# Patient Record
Sex: Female | Born: 1950 | Race: White | Hispanic: No | Marital: Married | Smoking: Former smoker
Health system: Southern US, Community
[De-identification: ages and names within clinical notes are randomized; demographics above are authoritative.]

## PROBLEM LIST (undated history)

## (undated) DIAGNOSIS — F419 Anxiety disorder, unspecified: Secondary | ICD-10-CM

## (undated) DIAGNOSIS — F32A Depression, unspecified: Secondary | ICD-10-CM

## (undated) DIAGNOSIS — C801 Malignant (primary) neoplasm, unspecified: Secondary | ICD-10-CM

## (undated) DIAGNOSIS — R519 Headache, unspecified: Secondary | ICD-10-CM

## (undated) DIAGNOSIS — G8929 Other chronic pain: Secondary | ICD-10-CM

## (undated) DIAGNOSIS — F329 Major depressive disorder, single episode, unspecified: Secondary | ICD-10-CM

## (undated) DIAGNOSIS — D649 Anemia, unspecified: Secondary | ICD-10-CM

## (undated) DIAGNOSIS — R51 Headache: Secondary | ICD-10-CM

## (undated) DIAGNOSIS — I10 Essential (primary) hypertension: Secondary | ICD-10-CM

## (undated) DIAGNOSIS — M1990 Unspecified osteoarthritis, unspecified site: Secondary | ICD-10-CM

## (undated) DIAGNOSIS — M25559 Pain in unspecified hip: Secondary | ICD-10-CM

## (undated) HISTORY — PX: ABDOMINAL HYSTERECTOMY: SHX81

## (undated) HISTORY — PX: CATARACT EXTRACTION, BILATERAL: SHX1313

## (undated) HISTORY — PX: KNEE ARTHROSCOPY WITH MENISCAL REPAIR: SHX5653

## (undated) HISTORY — PX: BONE MARROW HARVEST: SHX896

## (undated) HISTORY — PX: BACK SURGERY: SHX140

## (undated) HISTORY — PX: TONSILLECTOMY: SUR1361

## (undated) HISTORY — PX: APPENDECTOMY: SHX54

## (undated) HISTORY — PX: EYE SURGERY: SHX253

---

## 1981-06-03 HISTORY — PX: ECTOPIC PREGNANCY SURGERY: SHX613

## 2017-09-24 ENCOUNTER — Ambulatory Visit: Payer: Self-pay | Admitting: Orthopedic Surgery

## 2017-09-30 ENCOUNTER — Ambulatory Visit: Payer: Self-pay | Admitting: Orthopedic Surgery

## 2017-09-30 NOTE — H&P (View-Only) (Signed)
Kristina Rowland is an 67 y.o. female.   Chief Complaint: back and leg pain HPI: Reason for Visit: lumbar spine  Context: The patient is 1 month, 1 week out from when symptoms began.  Location (Lower Extremity): lower back pain  Severity: pain level 3/10  Past Medical Hx Spinal stenosis of lumbar region Degenerative lumbar spinal stenosis Lumbar radiculopathy Osteoarthritis of right knee joint ADD/ADHD Cancer Colitis/Stomach Ulcers Headaches High Cholesterol Hypertension Leukemia Migraines Swelling of Legs/Feet/Hands Urinary Tract Infection  Social History Smoking Status: Former smoker Current Non-smoker Tobacco-years of use: 17 Chewing tobacco: none Alcohol intake: None Hand Dominance: Right Work related injury?: N Advance directive: N Freight forwarder of Attorney: N  Surgical History Removal of tonsils Knee Joint Replacement Hysterectomy Appendectomy  Allergies:  Allergies  Allergen Reactions  . Cephalosporins Anaphylaxis, Swelling and Rash    Has patient had a PCN reaction causing immediate rash, facial/tongue/throat swelling, SOB or lightheadedness with hypotension: Yes Has patient had a PCN reaction causing severe rash involving mucus membranes or skin necrosis: Yes Has patient had a PCN reaction that required hospitalization:[facility] Has patient had a PCN reaction occurring within the last 10 years: No If all of the above answers are "NO", then may proceed with Cephalosporin use.   Marland Kitchen Penicillins Anaphylaxis, Swelling and Rash    Has patient had a PCN reaction causing immediate rash, facial/tongue/throat swelling, SOB or lightheadedness with hypotension: Yes Has patient had a PCN reaction causing severe rash involving mucus membranes or skin necrosis: Yes Has patient had a PCN reaction that required hospitalization:No Has patient had a PCN reaction occurring within the last 10 years: No If all of the above answers are "NO", then may  proceed with Cephalosporin use.     Family Hx Brother - Alcoholism   - Heart disease   - Hypertensive disorder Mother - Arthritis   - Diabetes mellitus   - Heart disease   - Hypertensive disorder   - Arthritis   - Hypertensive disorder Sister - Family history of cancer   - Heart disease   - Hypertensive disorder Father - Diabetes mellitus   - Heart disease   - Hypertensive disorder  Medications dextroamphetamine-amphetamine ER 25 mg 24hr capsule,extend release TAKE 1 CAPSULE BY MOUTH EVERY DAY  DULoxetine 60 mg capsule,delayed release TAKE 2 CAPSULES BY MOUTH EVERY DAY  estradiol 1 mg tablet TAKE 1 TABLET BY MOUTH EVERY DAY  fluconazole 150 mg tablet TAKE 1 TABLET X1 DOSE.  lisinopril 20 mg tablet TAKE 1 TABLET BY MOUTH EVERY DAY  nystatin 100,000 unit/mL oral suspension  OxyCONTIN 20 mg tablet,crush resistant,extended release TAKE 1 TABLET BY MOUTH EVERY 12 HOURS  simvastatin 40 mg tablet TAKE 1 TABLET BY MOUTH EVERY DAY AT NIGHT  Review of Systems  Constitutional: Positive for diaphoresis.  HENT: Negative.   Eyes: Negative.   Respiratory: Negative.   Cardiovascular: Negative.   Gastrointestinal: Negative.   Genitourinary: Negative.   Musculoskeletal: Positive for back pain and joint pain.  Skin: Negative.   Neurological: Positive for sensory change and focal weakness.       Difficulty with balance  Psychiatric/Behavioral: Negative.     There were no vitals taken for this visit. Physical Exam  Constitutional: She is oriented to person, place, and time. She appears well-developed. She appears distressed.  HENT:  Head: Normocephalic.  Eyes: Pupils are equal, round, and reactive to light. Conjunctivae are normal.  Cardiovascular: Normal rate and regular rhythm.  Respiratory: Effort normal.  GI: Soft.  Musculoskeletal:  Patient is a 67 year old female.  Gait and Station: Appearance: ambulating with no assistive devices and antalgic  gait.  Constitutional: General Appearance: healthy-appearing and distress (mild).  Psychiatric: Mood and Affect: active and alert.  Cardiovascular System: Edema Right: none; Dorsalis and posterior tibial pulses 2+. Edema Left: none.  Abdomen: Inspection and Palpation: non-distended and no tenderness.  Skin: Inspection and palpation: no rash.  Lumbar Spine: Inspection: normal alignment. Bony Palpation of the Lumbar Spine: tender at lumbosacral junction.. Bony Palpation of the Right Hip: no tenderness of the greater trochanter and tenderness of the SI joint; Pelvis stable. Bony Palpation of the Left Hip: no tenderness of the greater trochanter and tenderness of the SI joint. Soft Tissue Palpation on the Right: No flank pain with percussion. Active Range of Motion: limited flexion and extention.  Motor Strength: L1 Motor Strength on the Right: hip flexion iliopsoas 5/5. L1 Motor Strength on the Left: hip flexion iliopsoas 5/5. L2-L4 Motor Strength on the Right: knee extension quadriceps 5/5. L2-L4 Motor Strength on the Left: knee extension quadriceps 5/5. L5 Motor Strength on the Right: ankle dorsiflexion tibialis anterior 5/5 and great toe extension extensor hallucis longus 5/5. L5 Motor Strength on the Left: ankle dorsiflexion tibialis anterior 4/5 and great toe extension extensor hallucis longus 4/5. S1 Motor Strength on the Right: plantar flexion gastrocnemius 5/5. S1 Motor Strength on the Left: plantar flexion gastrocnemius 5/5.  Neurological System: Knee Reflex Right: normal (2). Knee Reflex Left: normal (2). Ankle Reflex Right: normal (2). Ankle Reflex Left: normal (2). Babinski Reflex Right: plantar reflex absent. Babinski Reflex Left: plantar reflex absent. Sensation on the Right: normal distal extremities. Sensation on the Left: normal distal extremities. Special Tests on the Right: no clonus of the ankle/knee and seated straight leg raising test positive. Special Tests on the Left: no clonus  of the ankle/knee and seated straight leg raising test positive.  Neurological: She is alert and oriented to person, place, and time.     Assessment/Plan X-rays of the lumbar spine demonstrates a grade 1 spinal listhesis at L4-5 without instability in flexion-extension.  Her MRI demonstrates severe multifactorial stenosis at that level. Moderate at L3-4.  We discussed decompression and Coflex versus decompression infusion she would like to proceed with a Coflex. The discussed the possibility of requiring a concomitant lateral mass fusion if the Coflex was unsuccessful.  We also discussed decompression at L3-4.  I had an extensive discussion with the patient concerning the pathology relevant anatomy and treatment options. At this point exhausting conservative treatment and in the presence of a neurologic deficit we discussed microlumbar decompression. I discussed the risks and benefits including bleeding, infection, DVT, PE, anesthetic complications, worsening in their symptoms, improvement in their symptoms, C SF leakage, epidural fibrosis, need for future surgeries such as revision discectomy and lumbar fusion. I also indicated that this is an operation to basically decompress the nerve root to allow recovery as opposed to fixing a herniated disc and that the incidence of recurrent chest disc herniation can approach 15%. Also that nerve root recovery is variable and may not recover completely.  I discussed the operative course including overnight in the hospital. Immediate ambulation. Follow-up in 2 weeks for suture removal. 6 weeks until healing of the herniation followed by 6 weeks of reconditioning and strengthening of the core musculature. Also discussed the need to employ the concepts of disc pressure management and core motion following the surgery to minimize the risk of recurrent disc  herniation. We will obtain preoperative clearance i if necessary and proceed accordingly.  If we can remove  the hypertrophic facet with an osteotome or a bur and preserve the spinous processes. She does have a fairly favorably oriented facet. And hopefully with the insertion of the  Coflex I will preclude instrumentation that would then require instrumentation and fusion at the adjacent segment of L3-4.  She understands at we may need to revise this. If this is not successful.  Plan microlumbar decompression L4-5, insertion of coflex  Cecilie Kicks., PA-C for Dr. Tonita Cong 09/30/2017, 11:31 AM

## 2017-09-30 NOTE — H&P (Signed)
Kristina Rowland is an 67 y.o. female.   Chief Complaint: back and leg pain HPI: Reason for Visit: lumbar spine  Context: The patient is 1 month, 1 week out from when symptoms began.  Location (Lower Extremity): lower back pain  Severity: pain level 3/10  Past Medical Hx Spinal stenosis of lumbar region Degenerative lumbar spinal stenosis Lumbar radiculopathy Osteoarthritis of right knee joint ADD/ADHD Cancer Colitis/Stomach Ulcers Headaches High Cholesterol Hypertension Leukemia Migraines Swelling of Legs/Feet/Hands Urinary Tract Infection  Social History Smoking Status: Former smoker Current Non-smoker Tobacco-years of use: 17 Chewing tobacco: none Alcohol intake: None Hand Dominance: Right Work related injury?: N Advance directive: N Freight forwarder of Attorney: N  Surgical History Removal of tonsils Knee Joint Replacement Hysterectomy Appendectomy  Allergies:  Allergies  Allergen Reactions  . Cephalosporins Anaphylaxis, Swelling and Rash    Has patient had a PCN reaction causing immediate rash, facial/tongue/throat swelling, SOB or lightheadedness with hypotension: Yes Has patient had a PCN reaction causing severe rash involving mucus membranes or skin necrosis: Yes Has patient had a PCN reaction that required hospitalization:[facility] Has patient had a PCN reaction occurring within the last 10 years: No If all of the above answers are "NO", then may proceed with Cephalosporin use.   Marland Kitchen Penicillins Anaphylaxis, Swelling and Rash    Has patient had a PCN reaction causing immediate rash, facial/tongue/throat swelling, SOB or lightheadedness with hypotension: Yes Has patient had a PCN reaction causing severe rash involving mucus membranes or skin necrosis: Yes Has patient had a PCN reaction that required hospitalization:No Has patient had a PCN reaction occurring within the last 10 years: No If all of the above answers are "NO", then may  proceed with Cephalosporin use.     Family Hx Brother - Alcoholism   - Heart disease   - Hypertensive disorder Mother - Arthritis   - Diabetes mellitus   - Heart disease   - Hypertensive disorder   - Arthritis   - Hypertensive disorder Sister - Family history of cancer   - Heart disease   - Hypertensive disorder Father - Diabetes mellitus   - Heart disease   - Hypertensive disorder  Medications dextroamphetamine-amphetamine ER 25 mg 24hr capsule,extend release TAKE 1 CAPSULE BY MOUTH EVERY DAY  DULoxetine 60 mg capsule,delayed release TAKE 2 CAPSULES BY MOUTH EVERY DAY  estradiol 1 mg tablet TAKE 1 TABLET BY MOUTH EVERY DAY  fluconazole 150 mg tablet TAKE 1 TABLET X1 DOSE.  lisinopril 20 mg tablet TAKE 1 TABLET BY MOUTH EVERY DAY  nystatin 100,000 unit/mL oral suspension  OxyCONTIN 20 mg tablet,crush resistant,extended release TAKE 1 TABLET BY MOUTH EVERY 12 HOURS  simvastatin 40 mg tablet TAKE 1 TABLET BY MOUTH EVERY DAY AT NIGHT  Review of Systems  Constitutional: Positive for diaphoresis.  HENT: Negative.   Eyes: Negative.   Respiratory: Negative.   Cardiovascular: Negative.   Gastrointestinal: Negative.   Genitourinary: Negative.   Musculoskeletal: Positive for back pain and joint pain.  Skin: Negative.   Neurological: Positive for sensory change and focal weakness.       Difficulty with balance  Psychiatric/Behavioral: Negative.     There were no vitals taken for this visit. Physical Exam  Constitutional: She is oriented to person, place, and time. She appears well-developed. She appears distressed.  HENT:  Head: Normocephalic.  Eyes: Pupils are equal, round, and reactive to light. Conjunctivae are normal.  Cardiovascular: Normal rate and regular rhythm.  Respiratory: Effort normal.  GI: Soft.  Musculoskeletal:  Patient is a 67 year old female.  Gait and Station: Appearance: ambulating with no assistive devices and antalgic  gait.  Constitutional: General Appearance: healthy-appearing and distress (mild).  Psychiatric: Mood and Affect: active and alert.  Cardiovascular System: Edema Right: none; Dorsalis and posterior tibial pulses 2+. Edema Left: none.  Abdomen: Inspection and Palpation: non-distended and no tenderness.  Skin: Inspection and palpation: no rash.  Lumbar Spine: Inspection: normal alignment. Bony Palpation of the Lumbar Spine: tender at lumbosacral junction.. Bony Palpation of the Right Hip: no tenderness of the greater trochanter and tenderness of the SI joint; Pelvis stable. Bony Palpation of the Left Hip: no tenderness of the greater trochanter and tenderness of the SI joint. Soft Tissue Palpation on the Right: No flank pain with percussion. Active Range of Motion: limited flexion and extention.  Motor Strength: L1 Motor Strength on the Right: hip flexion iliopsoas 5/5. L1 Motor Strength on the Left: hip flexion iliopsoas 5/5. L2-L4 Motor Strength on the Right: knee extension quadriceps 5/5. L2-L4 Motor Strength on the Left: knee extension quadriceps 5/5. L5 Motor Strength on the Right: ankle dorsiflexion tibialis anterior 5/5 and great toe extension extensor hallucis longus 5/5. L5 Motor Strength on the Left: ankle dorsiflexion tibialis anterior 4/5 and great toe extension extensor hallucis longus 4/5. S1 Motor Strength on the Right: plantar flexion gastrocnemius 5/5. S1 Motor Strength on the Left: plantar flexion gastrocnemius 5/5.  Neurological System: Knee Reflex Right: normal (2). Knee Reflex Left: normal (2). Ankle Reflex Right: normal (2). Ankle Reflex Left: normal (2). Babinski Reflex Right: plantar reflex absent. Babinski Reflex Left: plantar reflex absent. Sensation on the Right: normal distal extremities. Sensation on the Left: normal distal extremities. Special Tests on the Right: no clonus of the ankle/knee and seated straight leg raising test positive. Special Tests on the Left: no clonus  of the ankle/knee and seated straight leg raising test positive.  Neurological: She is alert and oriented to person, place, and time.     Assessment/Plan X-rays of the lumbar spine demonstrates a grade 1 spinal listhesis at L4-5 without instability in flexion-extension.  Her MRI demonstrates severe multifactorial stenosis at that level. Moderate at L3-4.  We discussed decompression and Coflex versus decompression infusion she would like to proceed with a Coflex. The discussed the possibility of requiring a concomitant lateral mass fusion if the Coflex was unsuccessful.  We also discussed decompression at L3-4.  I had an extensive discussion with the patient concerning the pathology relevant anatomy and treatment options. At this point exhausting conservative treatment and in the presence of a neurologic deficit we discussed microlumbar decompression. I discussed the risks and benefits including bleeding, infection, DVT, PE, anesthetic complications, worsening in their symptoms, improvement in their symptoms, C SF leakage, epidural fibrosis, need for future surgeries such as revision discectomy and lumbar fusion. I also indicated that this is an operation to basically decompress the nerve root to allow recovery as opposed to fixing a herniated disc and that the incidence of recurrent chest disc herniation can approach 15%. Also that nerve root recovery is variable and may not recover completely.  I discussed the operative course including overnight in the hospital. Immediate ambulation. Follow-up in 2 weeks for suture removal. 6 weeks until healing of the herniation followed by 6 weeks of reconditioning and strengthening of the core musculature. Also discussed the need to employ the concepts of disc pressure management and core motion following the surgery to minimize the risk of recurrent disc  herniation. We will obtain preoperative clearance i if necessary and proceed accordingly.  If we can remove  the hypertrophic facet with an osteotome or a bur and preserve the spinous processes. She does have a fairly favorably oriented facet. And hopefully with the insertion of the  Coflex I will preclude instrumentation that would then require instrumentation and fusion at the adjacent segment of L3-4.  She understands at we may need to revise this. If this is not successful.  Plan microlumbar decompression L4-5, insertion of coflex  Cecilie Kicks., PA-C for Dr. Tonita Cong 09/30/2017, 11:31 AM

## 2017-10-02 NOTE — Pre-Procedure Instructions (Signed)
Kristina Rowland  10/02/2017      [id number] - [place], [place] - [address] [place] [address] Phone: [phone] Fax: [phone]    Your procedure is scheduled on Thurs., Oct 09, 2017  Report to [facility] Admitting Entrance "A" at 8:00AM  Call this number if you have problems the morning of surgery:  [phone]   Remember:  Do not eat food or drink liquids after midnight.  Take these medicines the morning of surgery with A SIP OF WATER: DULoxetine (CYMBALTA) and OXYCONTIN  As of today, stop taking all Aspirins, Vitamins, Fish oils, and Herbal medications. Also stop all NSAIDS i.e. Advil, Ibuprofen, Motrin, Aleve, Anaprox, Naproxen, BC and Goody Powders. Including: Coconut Oil and COQ10   Do not wear jewelry, make-up or nail polish.  Do not wear lotions, powders, perfumes, or deodorant.  Do not shave 48 hours prior to surgery.    Do not bring valuables to the hospital.  [facility] is not responsible for any belongings or valuables.  Contacts, dentures or bridgework may not be worn into surgery.  Leave your suitcase in the car.  After surgery it may be brought to your room.  For patients admitted to the hospital, discharge time will be determined by your treatment team.  Patients discharged the day of surgery will not be allowed to drive home.   Special instructions:   [place]- Preparing For Surgery  Before surgery, you can play an important role. Because skin is not sterile, your skin needs to be as free of germs as possible. You can reduce the number of germs on your skin by washing with CHG (chlorahexidine gluconate) Soap before surgery.  CHG is an antiseptic cleaner which kills germs and bonds with the skin to continue killing germs even after washing.  Please do not use if you have an allergy to CHG or antibacterial soaps. If your skin becomes reddened/irritated stop using the CHG.  Do not shave  (including legs and underarms) for at least 48 hours prior to first CHG shower. It is OK to shave your face.  Please follow these instructions carefully.   1. Shower the NIGHT BEFORE SURGERY and the MORNING OF SURGERY with CHG.   2. If you chose to wash your hair, wash your hair first as usual with your normal shampoo.  3. After you shampoo, rinse your hair and body thoroughly to remove the shampoo.  4. Use CHG as you would any other liquid soap. You can apply CHG directly to the skin and wash gently with a scrungie or a clean washcloth.   5. Apply the CHG Soap to your body ONLY FROM THE NECK DOWN.  Do not use on open wounds or open sores. Avoid contact with your eyes, ears, mouth and genitals (private parts). Wash Face and genitals (private parts)  with your normal soap.  6. Wash thoroughly, paying special attention to the area where your surgery will be performed.  7. Thoroughly rinse your body with warm water from the neck down.  8. DO NOT shower/wash with your normal soap after using and rinsing off the CHG Soap.  9. Pat yourself dry with a CLEAN TOWEL.  10. Wear CLEAN PAJAMAS to bed the night before surgery, wear comfortable clothes the morning of surgery  11. Place CLEAN SHEETS on your bed the night of your first shower and DO NOT SLEEP WITH PETS.  Day of Surgery: Do  not apply any deodorants/lotions. Please wear clean clothes to the hospital/surgery center.    Please read over the following fact sheets that you were given. Pain Booklet, Coughing and Deep Breathing, MRSA Information and Surgical Site Infection Prevention

## 2017-10-03 ENCOUNTER — Ambulatory Visit (HOSPITAL_COMMUNITY): Admission: RE | Admit: 2017-10-03 | Payer: Medicare Other | Source: Ambulatory Visit

## 2017-10-03 ENCOUNTER — Encounter (HOSPITAL_COMMUNITY): Payer: Self-pay

## 2017-10-03 ENCOUNTER — Ambulatory Visit (HOSPITAL_COMMUNITY)
Admission: RE | Admit: 2017-10-03 | Discharge: 2017-10-03 | Disposition: A | Discharge status: Home / Self Care | Payer: Medicare Other | Source: Ambulatory Visit | Attending: Orthopedic Surgery | Admitting: Orthopedic Surgery

## 2017-10-03 ENCOUNTER — Other Ambulatory Visit: Payer: Self-pay

## 2017-10-03 ENCOUNTER — Encounter (HOSPITAL_COMMUNITY)
Admission: RE | Admit: 2017-10-03 | Discharge: 2017-10-03 | Disposition: A | Discharge status: Home / Self Care | Payer: Medicare Other | Source: Ambulatory Visit | Attending: Specialist | Admitting: Specialist

## 2017-10-03 DIAGNOSIS — M5126 Other intervertebral disc displacement, lumbar region: Secondary | ICD-10-CM | POA: Insufficient documentation

## 2017-10-03 DIAGNOSIS — I10 Essential (primary) hypertension: Secondary | ICD-10-CM | POA: Diagnosis not present

## 2017-10-03 DIAGNOSIS — Z01818 Encounter for other preprocedural examination: Secondary | ICD-10-CM | POA: Diagnosis present

## 2017-10-03 DIAGNOSIS — M5136 Other intervertebral disc degeneration, lumbar region: Secondary | ICD-10-CM | POA: Insufficient documentation

## 2017-10-03 DIAGNOSIS — Z01812 Encounter for preprocedural laboratory examination: Secondary | ICD-10-CM | POA: Insufficient documentation

## 2017-10-03 HISTORY — DX: Pain in unspecified hip: M25.559

## 2017-10-03 HISTORY — DX: Major depressive disorder, single episode, unspecified: F32.9

## 2017-10-03 HISTORY — DX: Unspecified osteoarthritis, unspecified site: M19.90

## 2017-10-03 HISTORY — DX: Headache, unspecified: R51.9

## 2017-10-03 HISTORY — DX: Malignant (primary) neoplasm, unspecified: C80.1

## 2017-10-03 HISTORY — DX: Depression, unspecified: F32.A

## 2017-10-03 HISTORY — DX: Other chronic pain: G89.29

## 2017-10-03 HISTORY — DX: Headache: R51

## 2017-10-03 HISTORY — DX: Essential (primary) hypertension: I10

## 2017-10-03 LAB — BASIC METABOLIC PANEL
Anion gap: 11 (ref 5–15)
BUN: 12 mg/dL (ref 6–20)
CO2: 25 mmol/L (ref 22–32)
Calcium: 8.9 mg/dL (ref 8.9–10.3)
Chloride: 102 mmol/L (ref 101–111)
Creatinine, Ser: 0.73 mg/dL (ref 0.44–1.00)
GFR calc Af Amer: >60 mL/min (ref >60)
GLUCOSE: 98 mg/dL (ref 65–99)
POTASSIUM: 4.1 mmol/L (ref 3.5–5.1)
Sodium: 138 mmol/L (ref 135–145)

## 2017-10-03 LAB — CBC
HCT: 43.5 % (ref 36.0–46.0)
Hemoglobin: 14.7 g/dL (ref 12.0–15.0)
MCH: 30.1 pg (ref 26.0–34.0)
MCHC: 33.8 g/dL (ref 30.0–36.0)
MCV: 89.1 fL (ref 78.0–100.0)
Platelets: 227 10*3/uL (ref 150–400)
RBC: 4.88 MIL/uL (ref 3.87–5.11)
RDW: 13 % (ref 11.5–15.5)
WBC: 6.6 10*3/uL (ref 4.0–10.5)

## 2017-10-03 LAB — SURGICAL PCR SCREEN
MRSA, PCR: NEGATIVE
STAPHYLOCOCCUS AUREUS: NEGATIVE

## 2017-10-03 NOTE — Progress Notes (Signed)
PCP - Dr. Jerrol Banana Cardiologist - patient denies  Chest x-ray - n/a EKG - 10/03/2017 Stress Test - patient denies ECHO - patient denies Cardiac Cath - patient denies  Sleep Study - patient denies  Anesthesia review: n/a  Patient denies shortness of breath, fever, cough and chest pain at PAT appointment   Patient verbalized understanding of instructions that were given to them at the PAT appointment. Patient was also instructed that they will need to review over the PAT instructions again at home before surgery.

## 2017-10-08 MED ORDER — VANCOMYCIN HCL 10 G IV SOLR
1500.0000 mg | INTRAVENOUS | Status: AC
  Administered 2017-10-09: 1500 mg via INTRAVENOUS
  Filled 2017-10-08: qty 1500

## 2017-10-09 ENCOUNTER — Observation Stay (HOSPITAL_COMMUNITY)
Admission: RE | Admit: 2017-10-09 | Discharge: 2017-10-10 | Disposition: A | Discharge status: Home / Self Care | Payer: Medicare Other | Source: Ambulatory Visit | Attending: Specialist | Admitting: Specialist

## 2017-10-09 ENCOUNTER — Ambulatory Visit (HOSPITAL_COMMUNITY): Payer: Medicare Other

## 2017-10-09 ENCOUNTER — Ambulatory Visit (HOSPITAL_COMMUNITY): Payer: Medicare Other | Admitting: Certified Registered Nurse Anesthetist

## 2017-10-09 ENCOUNTER — Encounter (HOSPITAL_COMMUNITY): Payer: Self-pay | Admitting: Certified Registered Nurse Anesthetist

## 2017-10-09 ENCOUNTER — Encounter (HOSPITAL_COMMUNITY)
Admission: RE | Disposition: A | Discharge status: Home / Self Care | Payer: Self-pay | Source: Ambulatory Visit | Attending: Specialist

## 2017-10-09 ENCOUNTER — Ambulatory Visit (HOSPITAL_COMMUNITY)
Admission: RE | Disposition: A | Discharge status: Home / Self Care | Payer: Self-pay | Source: Ambulatory Visit | Attending: Specialist

## 2017-10-09 ENCOUNTER — Inpatient Hospital Stay: Admit: 2017-10-09 | Payer: Medicare Other | Admitting: Specialist

## 2017-10-09 DIAGNOSIS — I700 Atherosclerosis of aorta: Secondary | ICD-10-CM | POA: Insufficient documentation

## 2017-10-09 DIAGNOSIS — Z87891 Personal history of nicotine dependence: Secondary | ICD-10-CM | POA: Insufficient documentation

## 2017-10-09 DIAGNOSIS — Z880 Allergy status to penicillin: Secondary | ICD-10-CM | POA: Diagnosis not present

## 2017-10-09 DIAGNOSIS — M48061 Spinal stenosis, lumbar region without neurogenic claudication: Secondary | ICD-10-CM | POA: Diagnosis present

## 2017-10-09 DIAGNOSIS — E7800 Pure hypercholesterolemia, unspecified: Secondary | ICD-10-CM | POA: Insufficient documentation

## 2017-10-09 DIAGNOSIS — Z881 Allergy status to other antibiotic agents status: Secondary | ICD-10-CM | POA: Diagnosis not present

## 2017-10-09 DIAGNOSIS — M5116 Intervertebral disc disorders with radiculopathy, lumbar region: Secondary | ICD-10-CM | POA: Insufficient documentation

## 2017-10-09 DIAGNOSIS — R2681 Unsteadiness on feet: Secondary | ICD-10-CM | POA: Insufficient documentation

## 2017-10-09 DIAGNOSIS — Z856 Personal history of leukemia: Secondary | ICD-10-CM | POA: Insufficient documentation

## 2017-10-09 DIAGNOSIS — I10 Essential (primary) hypertension: Secondary | ICD-10-CM | POA: Insufficient documentation

## 2017-10-09 DIAGNOSIS — Z79899 Other long term (current) drug therapy: Secondary | ICD-10-CM | POA: Diagnosis not present

## 2017-10-09 DIAGNOSIS — F329 Major depressive disorder, single episode, unspecified: Secondary | ICD-10-CM | POA: Insufficient documentation

## 2017-10-09 DIAGNOSIS — Z419 Encounter for procedure for purposes other than remedying health state, unspecified: Secondary | ICD-10-CM

## 2017-10-09 HISTORY — PX: LUMBAR LAMINECTOMY/DECOMPRESSION MICRODISCECTOMY: SHX5026

## 2017-10-09 SURGERY — LUMBAR LAMINECTOMY/DECOMPRESSION MICRODISCECTOMY 1 LEVEL
Anesthesia: General | Site: Back

## 2017-10-09 SURGERY — CANCELLED PROCEDURE
Anesthesia: General

## 2017-10-09 MED ORDER — HYDROMORPHONE HCL 2 MG/ML IJ SOLN
INTRAMUSCULAR | Status: AC
  Filled 2017-10-09: qty 1

## 2017-10-09 MED ORDER — FENTANYL CITRATE (PF) 250 MCG/5ML IJ SOLN
INTRAMUSCULAR | Status: AC
  Filled 2017-10-09: qty 5

## 2017-10-09 MED ORDER — FENTANYL CITRATE (PF) 250 MCG/5ML IJ SOLN
INTRAMUSCULAR | Status: DC | PRN
  Administered 2017-10-09 (×3): 50 ug via INTRAVENOUS
  Administered 2017-10-09: 100 ug via INTRAVENOUS
  Administered 2017-10-09 (×2): 50 ug via INTRAVENOUS

## 2017-10-09 MED ORDER — DEXAMETHASONE SODIUM PHOSPHATE 10 MG/ML IJ SOLN
INTRAMUSCULAR | Status: DC | PRN
  Administered 2017-10-09: 5 mg via INTRAVENOUS

## 2017-10-09 MED ORDER — DOCUSATE SODIUM 100 MG PO CAPS: 100.0000 mg | ORAL_CAPSULE | Freq: Two times a day (BID) | ORAL | 2 refills | Status: AC | PRN

## 2017-10-09 MED ORDER — BACITRACIN 50000 UNITS IM SOLR
INTRAMUSCULAR | Status: DC | PRN
  Administered 2017-10-09: 18:00:00

## 2017-10-09 MED ORDER — 0.9 % SODIUM CHLORIDE (POUR BTL) OPTIME
TOPICAL | Status: DC | PRN
  Administered 2017-10-09: 1000 mL

## 2017-10-09 MED ORDER — ACYCLOVIR 400 MG PO TABS
400.0000 mg | ORAL_TABLET | Freq: Two times a day (BID) | ORAL | Status: DC
  Administered 2017-10-09: 400 mg via ORAL
  Filled 2017-10-09 (×3): qty 1

## 2017-10-09 MED ORDER — PROPOFOL 10 MG/ML IV BOLUS
INTRAVENOUS | Status: AC
  Filled 2017-10-09: qty 20

## 2017-10-09 MED ORDER — LACTATED RINGERS IV SOLN: INTRAVENOUS | Status: DC

## 2017-10-09 MED ORDER — PROPOFOL 10 MG/ML IV BOLUS
INTRAVENOUS | Status: DC | PRN
  Administered 2017-10-09: 150 mg via INTRAVENOUS
  Administered 2017-10-09: 50 mg via INTRAVENOUS

## 2017-10-09 MED ORDER — METHOCARBAMOL 500 MG PO TABS: 500.0000 mg | ORAL_TABLET | Freq: Four times a day (QID) | ORAL | 1 refills | Status: DC | PRN

## 2017-10-09 MED ORDER — AMPHETAMINE-DEXTROAMPHET ER 5 MG PO CP24
25.0000 mg | ORAL_CAPSULE | Freq: Every day | ORAL | Status: DC
  Administered 2017-10-10: 25 mg via ORAL
  Filled 2017-10-09: qty 5

## 2017-10-09 MED ORDER — BISACODYL 5 MG PO TBEC: 5.0000 mg | DELAYED_RELEASE_TABLET | Freq: Every day | ORAL | Status: DC | PRN

## 2017-10-09 MED ORDER — PHENOL 1.4 % MT LIQD: 1.0000 | OROMUCOSAL | Status: DC | PRN

## 2017-10-09 MED ORDER — KCL IN DEXTROSE-NACL 20-5-0.45 MEQ/L-%-% IV SOLN
INTRAVENOUS | Status: DC
  Administered 2017-10-09: 22:00:00 via INTRAVENOUS
  Filled 2017-10-09: qty 1000

## 2017-10-09 MED ORDER — VITAMIN D 1000 UNITS PO TABS
5000.0000 [IU] | ORAL_TABLET | Freq: Every day | ORAL | Status: DC
  Administered 2017-10-10: 5000 [IU] via ORAL
  Filled 2017-10-09: qty 5

## 2017-10-09 MED ORDER — FENTANYL CITRATE (PF) 100 MCG/2ML IJ SOLN: INTRAMUSCULAR | Status: DC | PRN

## 2017-10-09 MED ORDER — VANCOMYCIN HCL IN DEXTROSE 1-5 GM/200ML-% IV SOLN
1000.0000 mg | Freq: Once | INTRAVENOUS | Status: AC
  Administered 2017-10-10: 1000 mg via INTRAVENOUS
  Filled 2017-10-09: qty 200

## 2017-10-09 MED ORDER — BUPIVACAINE-EPINEPHRINE (PF) 0.5% -1:200000 IJ SOLN
INTRAMUSCULAR | Status: AC
  Filled 2017-10-09: qty 30

## 2017-10-09 MED ORDER — ACETAMINOPHEN 325 MG PO TABS
650.0000 mg | ORAL_TABLET | ORAL | Status: DC | PRN
  Administered 2017-10-10 (×2): 650 mg via ORAL
  Filled 2017-10-09 (×2): qty 2

## 2017-10-09 MED ORDER — MIDAZOLAM HCL 2 MG/2ML IJ SOLN
INTRAMUSCULAR | Status: AC
  Filled 2017-10-09: qty 2

## 2017-10-09 MED ORDER — ONDANSETRON HCL 4 MG/2ML IJ SOLN
INTRAMUSCULAR | Status: AC
  Filled 2017-10-09: qty 2

## 2017-10-09 MED ORDER — PHENYLEPHRINE 40 MCG/ML (10ML) SYRINGE FOR IV PUSH (FOR BLOOD PRESSURE SUPPORT)
PREFILLED_SYRINGE | INTRAVENOUS | Status: AC
  Filled 2017-10-09: qty 10

## 2017-10-09 MED ORDER — OXYCODONE HCL 5 MG PO TABS: 5.0000 mg | ORAL_TABLET | ORAL | 0 refills | Status: AC | PRN

## 2017-10-09 MED ORDER — ALUM & MAG HYDROXIDE-SIMETH 200-200-20 MG/5ML PO SUSP: 30.0000 mL | Freq: Four times a day (QID) | ORAL | Status: DC | PRN

## 2017-10-09 MED ORDER — ACETAMINOPHEN 10 MG/ML IV SOLN: 1000.0000 mg | INTRAVENOUS | Status: DC

## 2017-10-09 MED ORDER — ACETAMINOPHEN 650 MG RE SUPP: 650.0000 mg | RECTAL | Status: DC | PRN

## 2017-10-09 MED ORDER — FENTANYL CITRATE (PF) 100 MCG/2ML IJ SOLN
50.0000 ug | Freq: Once | INTRAMUSCULAR | Status: AC
  Administered 2017-10-09: 50 ug via INTRAVENOUS
  Filled 2017-10-09: qty 2

## 2017-10-09 MED ORDER — SUGAMMADEX SODIUM 200 MG/2ML IV SOLN
INTRAVENOUS | Status: AC
  Filled 2017-10-09: qty 2

## 2017-10-09 MED ORDER — DOCUSATE SODIUM 100 MG PO CAPS
100.0000 mg | ORAL_CAPSULE | Freq: Two times a day (BID) | ORAL | Status: DC
  Administered 2017-10-10: 100 mg via ORAL
  Filled 2017-10-09 (×2): qty 1

## 2017-10-09 MED ORDER — MENTHOL 3 MG MT LOZG: 1.0000 | LOZENGE | OROMUCOSAL | Status: DC | PRN

## 2017-10-09 MED ORDER — POLYETHYLENE GLYCOL 3350 17 G PO PACK: 17.0000 g | PACK | Freq: Every day | ORAL | Status: DC | PRN

## 2017-10-09 MED ORDER — ROCURONIUM BROMIDE 100 MG/10ML IV SOLN
INTRAVENOUS | Status: DC | PRN
  Administered 2017-10-09: 50 mg via INTRAVENOUS
  Administered 2017-10-09: 30 mg via INTRAVENOUS
  Administered 2017-10-09: 10 mg via INTRAVENOUS

## 2017-10-09 MED ORDER — ROCURONIUM BROMIDE 50 MG/5ML IV SOLN
INTRAVENOUS | Status: AC
  Filled 2017-10-09: qty 3

## 2017-10-09 MED ORDER — MIDAZOLAM HCL 2 MG/2ML IJ SOLN
1.0000 mg | Freq: Once | INTRAMUSCULAR | Status: AC
  Administered 2017-10-09: 1 mg via INTRAVENOUS
  Filled 2017-10-09: qty 2

## 2017-10-09 MED ORDER — OXYCODONE HCL 5 MG PO TABS
10.0000 mg | ORAL_TABLET | ORAL | Status: DC | PRN
  Administered 2017-10-10 (×3): 10 mg via ORAL
  Filled 2017-10-09 (×3): qty 2

## 2017-10-09 MED ORDER — ONDANSETRON HCL 4 MG/2ML IJ SOLN: 4.0000 mg | Freq: Four times a day (QID) | INTRAMUSCULAR | Status: DC | PRN

## 2017-10-09 MED ORDER — METHOCARBAMOL 500 MG PO TABS
500.0000 mg | ORAL_TABLET | Freq: Four times a day (QID) | ORAL | Status: DC | PRN
  Administered 2017-10-10 (×3): 500 mg via ORAL
  Filled 2017-10-09 (×3): qty 1

## 2017-10-09 MED ORDER — PHENYLEPHRINE HCL 10 MG/ML IJ SOLN
INTRAMUSCULAR | Status: DC | PRN
  Administered 2017-10-09: 80 ug via INTRAVENOUS
  Administered 2017-10-09: 120 ug via INTRAVENOUS
  Administered 2017-10-09 (×2): 80 ug via INTRAVENOUS

## 2017-10-09 MED ORDER — METHOCARBAMOL 1000 MG/10ML IJ SOLN
500.0000 mg | Freq: Four times a day (QID) | INTRAVENOUS | Status: DC | PRN
  Filled 2017-10-09: qty 5

## 2017-10-09 MED ORDER — MIDAZOLAM HCL 5 MG/5ML IJ SOLN
INTRAMUSCULAR | Status: DC | PRN
  Administered 2017-10-09 (×2): 2 mg via INTRAVENOUS

## 2017-10-09 MED ORDER — THROMBIN 20000 UNITS EX SOLR
CUTANEOUS | Status: AC
  Filled 2017-10-09: qty 20000

## 2017-10-09 MED ORDER — POLYETHYLENE GLYCOL 3350 17 G PO PACK: 17.0000 g | PACK | Freq: Every day | ORAL | 0 refills | Status: DC

## 2017-10-09 MED ORDER — THROMBIN (RECOMBINANT) 20000 UNITS EX SOLR
CUTANEOUS | Status: DC | PRN
  Administered 2017-10-09: 18:00:00 via TOPICAL

## 2017-10-09 MED ORDER — LACTATED RINGERS IV SOLN
INTRAVENOUS | Status: DC
  Administered 2017-10-09 (×2): via INTRAVENOUS

## 2017-10-09 MED ORDER — HYDROMORPHONE HCL 2 MG/ML IJ SOLN
0.2500 mg | INTRAMUSCULAR | Status: DC | PRN
  Administered 2017-10-09: 0.5 mg via INTRAVENOUS

## 2017-10-09 MED ORDER — MIDAZOLAM HCL 2 MG/2ML IJ SOLN: 0.5000 mg | Freq: Once | INTRAMUSCULAR | Status: DC | PRN

## 2017-10-09 MED ORDER — RISAQUAD PO CAPS
1.0000 | ORAL_CAPSULE | Freq: Every day | ORAL | Status: DC
  Administered 2017-10-10: 1 via ORAL
  Filled 2017-10-09: qty 1

## 2017-10-09 MED ORDER — SUCCINYLCHOLINE CHLORIDE 200 MG/10ML IV SOSY
PREFILLED_SYRINGE | INTRAVENOUS | Status: AC
  Filled 2017-10-09: qty 10

## 2017-10-09 MED ORDER — PHENYLEPHRINE HCL 10 MG/ML IJ SOLN
INTRAVENOUS | Status: DC | PRN
  Administered 2017-10-09: 50 ug/min via INTRAVENOUS

## 2017-10-09 MED ORDER — HYDROMORPHONE HCL 2 MG/ML IJ SOLN
0.5000 mg | INTRAMUSCULAR | Status: DC | PRN
  Administered 2017-10-09: 1 mg via INTRAVENOUS
  Filled 2017-10-09: qty 1

## 2017-10-09 MED ORDER — BUPIVACAINE-EPINEPHRINE 0.5% -1:200000 IJ SOLN
INTRAMUSCULAR | Status: DC | PRN
  Administered 2017-10-09: 15 mL

## 2017-10-09 MED ORDER — OXYCODONE HCL 5 MG PO TABS: 5.0000 mg | ORAL_TABLET | ORAL | Status: DC | PRN

## 2017-10-09 MED ORDER — DULOXETINE HCL 60 MG PO CPEP
60.0000 mg | ORAL_CAPSULE | Freq: Every day | ORAL | Status: DC
  Administered 2017-10-10: 60 mg via ORAL
  Filled 2017-10-09: qty 1

## 2017-10-09 MED ORDER — ONDANSETRON HCL 4 MG PO TABS: 4.0000 mg | ORAL_TABLET | Freq: Four times a day (QID) | ORAL | Status: DC | PRN

## 2017-10-09 MED ORDER — OXYCODONE HCL ER 20 MG PO T12A
20.0000 mg | EXTENDED_RELEASE_TABLET | Freq: Two times a day (BID) | ORAL | Status: DC
  Administered 2017-10-09 – 2017-10-10 (×2): 20 mg via ORAL
  Filled 2017-10-09 (×2): qty 1

## 2017-10-09 MED ORDER — DEXAMETHASONE SODIUM PHOSPHATE 10 MG/ML IJ SOLN
INTRAMUSCULAR | Status: AC
  Filled 2017-10-09: qty 1

## 2017-10-09 MED ORDER — PROMETHAZINE HCL 25 MG/ML IJ SOLN: 6.2500 mg | INTRAMUSCULAR | Status: DC | PRN

## 2017-10-09 MED ORDER — LIDOCAINE HCL (CARDIAC) PF 100 MG/5ML IV SOSY
PREFILLED_SYRINGE | INTRAVENOUS | Status: DC | PRN
  Administered 2017-10-09: 100 mg via INTRAVENOUS

## 2017-10-09 MED ORDER — LISINOPRIL 20 MG PO TABS
20.0000 mg | ORAL_TABLET | Freq: Every day | ORAL | Status: DC
  Filled 2017-10-09: qty 1

## 2017-10-09 MED ORDER — MEPERIDINE HCL 50 MG/ML IJ SOLN: 6.2500 mg | INTRAMUSCULAR | Status: DC | PRN

## 2017-10-09 MED ORDER — ZOLPIDEM TARTRATE 5 MG PO TABS: 5.0000 mg | ORAL_TABLET | Freq: Every evening | ORAL | Status: DC | PRN

## 2017-10-09 MED ORDER — MAGNESIUM CITRATE PO SOLN: 1.0000 | Freq: Once | ORAL | Status: DC | PRN

## 2017-10-09 MED ORDER — SUGAMMADEX SODIUM 200 MG/2ML IV SOLN
INTRAVENOUS | Status: DC | PRN
  Administered 2017-10-09: 200 mg via INTRAVENOUS

## 2017-10-09 MED ORDER — ONDANSETRON HCL 4 MG/2ML IJ SOLN
INTRAMUSCULAR | Status: DC | PRN
  Administered 2017-10-09: 4 mg via INTRAVENOUS

## 2017-10-09 MED ORDER — LIDOCAINE 2% (20 MG/ML) 5 ML SYRINGE
INTRAMUSCULAR | Status: AC
  Filled 2017-10-09: qty 15

## 2017-10-09 SURGICAL SUPPLY — 53 items
BAG DECANTER FOR FLEXI CONT (MISCELLANEOUS) ×3 IMPLANT
CLEANER TIP ELECTROSURG 2X2 (MISCELLANEOUS) ×3 IMPLANT
CLOSURE WOUND 1/2 X4 (GAUZE/BANDAGES/DRESSINGS) ×1
CLOTH 2% CHLOROHEXIDINE 3PK (PERSONAL CARE ITEMS) ×3 IMPLANT
CONT SPEC 4OZ CLIKSEAL STRL BL (MISCELLANEOUS) ×3 IMPLANT
DRAPE LAPAROTOMY 100X72X124 (DRAPES) ×3 IMPLANT
DRAPE MICROSCOPE LEICA (MISCELLANEOUS) ×3 IMPLANT
DRAPE SHEET LG 3/4 BI-LAMINATE (DRAPES) ×3 IMPLANT
DRAPE SURG 17X11 SM STRL (DRAPES) ×3 IMPLANT
DRAPE UTILITY XL STRL (DRAPES) ×3 IMPLANT
DRSG AQUACEL AG ADV 3.5X 4 (GAUZE/BANDAGES/DRESSINGS) IMPLANT
DRSG AQUACEL AG ADV 3.5X 6 (GAUZE/BANDAGES/DRESSINGS) ×3 IMPLANT
DRSG TELFA 3X8 NADH (GAUZE/BANDAGES/DRESSINGS) IMPLANT
DURAPREP 26ML APPLICATOR (WOUND CARE) ×3 IMPLANT
DURASEAL SPINE SEALANT 3ML (MISCELLANEOUS) IMPLANT
ELECT BLADE 4.0 EZ CLEAN MEGAD (MISCELLANEOUS)
ELECT REM PT RETURN 9FT ADLT (ELECTROSURGICAL) ×3
ELECTRODE BLDE 4.0 EZ CLN MEGD (MISCELLANEOUS) IMPLANT
ELECTRODE REM PT RTRN 9FT ADLT (ELECTROSURGICAL) ×1 IMPLANT
GLOVE BIO SURGEON STRL SZ 6.5 (GLOVE) ×2 IMPLANT
GLOVE BIO SURGEON STRL SZ7 (GLOVE) ×3 IMPLANT
GLOVE BIO SURGEONS STRL SZ 6.5 (GLOVE) ×1
GLOVE BIOGEL PI IND STRL 7.0 (GLOVE) ×1 IMPLANT
GLOVE BIOGEL PI INDICATOR 7.0 (GLOVE) ×2
GLOVE SURG SS PI 7.5 STRL IVOR (GLOVE) ×3 IMPLANT
GLOVE SURG SS PI 8.0 STRL IVOR (GLOVE) ×6 IMPLANT
GOWN STRL REUS W/ TWL LRG LVL3 (GOWN DISPOSABLE) ×1 IMPLANT
GOWN STRL REUS W/ TWL XL LVL3 (GOWN DISPOSABLE) ×1 IMPLANT
GOWN STRL REUS W/TWL LRG LVL3 (GOWN DISPOSABLE) ×2
GOWN STRL REUS W/TWL XL LVL3 (GOWN DISPOSABLE) ×2
IV CATH 14GX2 1/4 (CATHETERS) ×3 IMPLANT
KIT BASIN OR (CUSTOM PROCEDURE TRAY) ×3 IMPLANT
NEEDLE 22X1 1/2 (OR ONLY) (NEEDLE) ×3 IMPLANT
NEEDLE SPNL 18GX3.5 QUINCKE PK (NEEDLE) ×6 IMPLANT
PACK LAMINECTOMY NEURO (CUSTOM PROCEDURE TRAY) ×3 IMPLANT
PATTIES SURGICAL .75X.75 (GAUZE/BANDAGES/DRESSINGS) ×3 IMPLANT
RUBBERBAND STERILE (MISCELLANEOUS) ×6 IMPLANT
SPONGE LAP 4X18 X RAY DECT (DISPOSABLE) IMPLANT
SPONGE SURGIFOAM ABS GEL 100 (HEMOSTASIS) ×3 IMPLANT
STAPLER VISISTAT (STAPLE) ×3 IMPLANT
STRIP CLOSURE SKIN 1/2X4 (GAUZE/BANDAGES/DRESSINGS) ×2 IMPLANT
SUT NURALON 4 0 TR CR/8 (SUTURE) IMPLANT
SUT PROLENE 3 0 PS 2 (SUTURE) IMPLANT
SUT VIC AB 1 CT1 27 (SUTURE) ×4
SUT VIC AB 1 CT1 27XBRD ANTBC (SUTURE) ×2 IMPLANT
SUT VIC AB 1-0 CT2 27 (SUTURE) IMPLANT
SUT VIC AB 2-0 CT1 27 (SUTURE) ×4
SUT VIC AB 2-0 CT1 TAPERPNT 27 (SUTURE) ×2 IMPLANT
SUT VIC AB 2-0 CT2 27 (SUTURE) IMPLANT
SYR 3ML LL SCALE MARK (SYRINGE) ×3 IMPLANT
TOWEL GREEN STERILE (TOWEL DISPOSABLE) ×3 IMPLANT
TOWEL GREEN STERILE FF (TOWEL DISPOSABLE) ×3 IMPLANT
YANKAUER SUCT BULB TIP NO VENT (SUCTIONS) ×3 IMPLANT

## 2017-10-09 NOTE — Interval H&P Note (Signed)
History and Physical Interval Note:  10/09/2017 4:15 PM  Kristina Rowland  has presented today for surgery, with the diagnosis of Lumbar  The various methods of treatment have been discussed with the patient and family. After consideration of risks, benefits and other options for treatment, the patient has consented to  Procedure(s): MICROLUMBAR DECOMPRESSION LUMBAR 4-5; INSERTION OF COFLEX (N/A) as a surgical intervention .  The patient's history has been reviewed, patient examined, no change in status, stable for surgery.  I have reviewed the patient's chart and labs.  Questions were answered to the patient's satisfaction.     [name] C

## 2017-10-09 NOTE — Transfer of Care (Addendum)
Immediate Anesthesia Transfer of Care Note  Patient: Kristina Rowland  Procedure(s) Performed: Microlumbar decompression L4-5, insertion of Coflex (N/A )  Patient Location: short stay  Anesthesia Type:only sedation; GA canceled  Level of Consciousness: awake, alert , oriented and patient cooperative  Airway & Oxygen Therapy: Patient Spontanous Breathing  Post-op Assessment: Report given to RN and Post -op Vital signs reviewed and stable  Post vital signs: Reviewed and stable  Last Vitals:  Vitals Value Taken Time  BP    Temp    Pulse    Resp    SpO2      Last Pain:  Vitals:   10/09/17 0901  TempSrc:   PainSc: 6       Patients Stated Pain Goal: 3 (93/81/01 [id number])  Complications: No apparent anesthesia complications

## 2017-10-09 NOTE — OR Nursing (Signed)
Patient brought to OR, then Dr Tonita Cong informed us he may have an emergency with his first patient and to wait on going to sleep. After about 45 minutes, he decided to proceed with emergency surgery and to return Ms Denault to the holding area.

## 2017-10-09 NOTE — Plan of Care (Signed)
  Problem: Pain Managment: Goal: General experience of comfort will improve Outcome: Progressing   

## 2017-10-09 NOTE — Progress Notes (Signed)
Pharmacy Antibiotic Note  Kristina Rowland is a 67 y.o. female admitted on 10/09/2017 s/p lumbar decompression.  Patient does not have a drain and Pharmacy has been consulted for vancomycin x 1 dose for surgical prophylaxis.  SCr 0.73, CrCL 82 ml/min.   Plan: Vanc 1gm IV x 1 on 5/10 at [place] will sign off     Temp (24hrs), Avg:97.9 F (36.6 C), Min:97.4 F (36.3 C), Max:98.3 F (36.8 C)  Recent Labs  Lab 10/03/17 1346  WBC 6.6  CREATININE 0.73    Estimated Creatinine Clearance: 82.1 mL/min (by C-G formula based on SCr of 0.73 mg/dL).    Allergies  Allergen Reactions  . Cephalosporins Anaphylaxis, Swelling and Rash    Has patient had a PCN reaction causing immediate rash, facial/tongue/throat swelling, SOB or lightheadedness with hypotension: Yes Has patient had a PCN reaction causing severe rash involving mucus membranes or skin necrosis: Yes Has patient had a PCN reaction that required hospitalization:WAS [place] Has patient had a PCN reaction occurring within the last 10 years: No If all of the above answers are "NO", then may proceed with Cephalosporin use.   Marland Kitchen Penicillins Anaphylaxis, Swelling and Rash    Has patient had a PCN reaction causing immediate rash, facial/tongue/throat swelling, SOB or lightheadedness with hypotension: Yes Has patient had a PCN reaction causing severe rash involving mucus membranes or skin necrosis: Yes Has patient had a PCN reaction that required hospitalization:No Has patient had a PCN reaction occurring within the last 10 years: No If all of the above answers are "NO", then may proceed with Cephalosporin use.       Temisha Murley D. Mina Marble, PharmD, BCPS, [place] Pager:  [phone] 10/09/2017, 8:41 PM

## 2017-10-09 NOTE — Brief Op Note (Signed)
10/09/2017  7:30 PM  PATIENT:  Kristina Rowland  67 y.o. female  PRE-OPERATIVE DIAGNOSIS:  Stenosis, spondylolisthesis L4-5  POST-OPERATIVE DIAGNOSIS:  * No post-op diagnosis entered *  PROCEDURE:  Procedure(s): CANCELLED PROCEDURE  SURGEON:  Surgeon(s) and Role:    Susa Day, MD - Primary  PHYSICIAN ASSISTANT:   ASSISTANTS: [place]   ANESTHESIA:   spinal  EBL:  100   BLOOD ADMINISTERED:none  DRAINS: none   LOCAL MEDICATIONS USED:  MARCAINE     SPECIMEN:  No Specimen  DISPOSITION OF SPECIMEN:  N/A  COUNTS:  YES  TOURNIQUET:  * No tourniquets in log *  DICTATION: .Other Dictation: Dictation Number [id number]  PLAN OF CARE: Admit for overnight observation  PATIENT DISPOSITION:  PACU - hemodynamically stable.   Delay start of Pharmacological VTE agent (>24hrs) due to surgical blood loss or risk of bleeding: yes

## 2017-10-09 NOTE — Anesthesia Procedure Notes (Signed)
Procedure Name: Intubation Date/Time: 10/09/2017 4:45 PM Performed by: White, Amedeo Plenty, CRNA Pre-anesthesia Checklist: Patient identified, Emergency Drugs available, Suction available and Patient being monitored Patient Re-evaluated:Patient Re-evaluated prior to induction Oxygen Delivery Method: Circle System Utilized Preoxygenation: Pre-oxygenation with 100% oxygen Induction Type: IV induction Ventilation: Mask ventilation without difficulty Laryngoscope Size: Mac and 3 Grade View: Grade II Tube type: Oral Tube size: 7.0 mm Number of attempts: 1 Airway Equipment and Method: Stylet Placement Confirmation: ETT inserted through vocal cords under direct vision,  positive ETCO2 and breath sounds checked- equal and bilateral Secured at: 22 cm Tube secured with: Tape Dental Injury: Teeth and Oropharynx as per pre-operative assessment

## 2017-10-09 NOTE — Discharge Instructions (Signed)

## 2017-10-09 NOTE — Anesthesia Preprocedure Evaluation (Addendum)
Anesthesia Evaluation  Patient identified by MRN, date of birth, ID band Patient awake    Reviewed: Allergy & Precautions, NPO status , Patient's Chart, lab work & pertinent test results  History of Anesthesia Complications Negative for: history of anesthetic complications  Airway Mallampati: II  TM Distance: >3 FB Neck ROM: Full    Dental  (+) Dental Advisory Given   Pulmonary former smoker (quit 1986),    breath sounds clear to auscultation       Cardiovascular hypertension, Pt. on medications (-) angina Rhythm:Regular Rate:Normal     Neuro/Psych  Headaches, Depression Chronic back pain: narcotics    GI/Hepatic negative GI ROS, Neg liver ROS,   Endo/Other  Morbid obesity  Renal/GU negative Renal ROS     Musculoskeletal  (+) Arthritis ,   Abdominal (+) + obese,   Peds  Hematology negative hematology ROS (+)   Anesthesia Other Findings   Reproductive/Obstetrics                            Anesthesia Physical Anesthesia Plan  ASA: II  Anesthesia Plan: General   Post-op Pain Management:    Induction: Intravenous  PONV Risk Score and Plan: 4 or greater and Ondansetron, Dexamethasone and Scopolamine patch - Pre-op  Airway Management Planned: Oral ETT  Additional Equipment:   Intra-op Plan:   Post-operative Plan: Extubation in OR  Informed Consent: I have reviewed the patients History and Physical, chart, labs and discussed the procedure including the risks, benefits and alternatives for the proposed anesthesia with the patient or authorized representative who has indicated his/her understanding and acceptance.   Dental advisory given  Plan Discussed with: Surgeon and CRNA  Anesthesia Plan Comments: (Plan routine monitors, GETA)        Anesthesia Quick Evaluation

## 2017-10-09 NOTE — Anesthesia Postprocedure Evaluation (Signed)
Anesthesia Post Note  Patient: Kristina Rowland  Procedure(s) Performed: MICROLUMBAR DECOMPRESSION LUMBAR 4-5 (N/A Back)     Patient location during evaluation: PACU Anesthesia Type: General Level of consciousness: sedated Pain management: pain level controlled Vital Signs Assessment: post-procedure vital signs reviewed and stable Respiratory status: spontaneous breathing and respiratory function stable Cardiovascular status: stable Postop Assessment: no apparent nausea or vomiting Anesthetic complications: no    Last Vitals:  Vitals:   10/09/17 2016 10/09/17 2018  BP: (!) 109/54   Pulse: 84 92  Resp: 14 (!) 23  Temp: (!) 36.3 C   SpO2: 94% 96%    Last Pain:  Vitals:   10/09/17 2016  TempSrc:   PainSc: [place]

## 2017-10-10 ENCOUNTER — Encounter (HOSPITAL_COMMUNITY): Payer: Self-pay | Admitting: *Deleted

## 2017-10-10 ENCOUNTER — Encounter (HOSPITAL_COMMUNITY): Payer: Self-pay | Admitting: Specialist

## 2017-10-10 DIAGNOSIS — M48061 Spinal stenosis, lumbar region without neurogenic claudication: Secondary | ICD-10-CM | POA: Diagnosis not present

## 2017-10-10 MED FILL — Thrombin For Soln 20000 Unit: CUTANEOUS | Qty: 1 | Status: AC

## 2017-10-10 NOTE — Evaluation (Signed)
Physical Therapy Evaluation Patient Details Name: Kristina Rowland MRN: [id number] DOB: 11-22-1950 Today's Date: 10/10/2017   History of Present Illness  67 y.o. female admitte on 10/09/17 for lumbar decompression L4-5.  Pt with significant PMH of HTN, HA, chronic hip pain , leukemia, bil knee arthroscopy.    Clinical Impression  Pt was able to walk with min guard assist around the unit, practice stairs simulating her upstairs climb to get to her bedroom and review back precautions for comfort while healing.   PT to follow acutely for deficits listed below.       Follow Up Recommendations No PT follow up    Equipment Recommendations  None recommended by PT    Recommendations for Other Services   NA    Precautions / Restrictions Precautions Precautions: Back Precaution Booklet Issued: Yes (comment) Precaution Comments: handout given and reviewed back precautions for comfort, no brace Restrictions Weight Bearing Restrictions: (no bending,arching & twisting)      Mobility  Bed Mobility Overal bed mobility: Needs Assistance Bed Mobility: Supine to Sit     Supine to sit: HOB elevated     General bed mobility comments: Min guard assist to help progress legs over the side of the bed, HOB maximally elevated and pt using arms to pull up.  did not flatten bed to practice log roll as pt had to urgently go to the bathroom.  We did verbally review proper log roll.    Transfers Overall transfer level: Needs assistance Equipment used: None Transfers: Sit to/from Stand Sit to Stand: Min guard         General transfer comment: min guard assist for safety and to steady her on her first stand up OOB.    Ambulation/Gait Ambulation/Gait assistance: Min guard Ambulation Distance (Feet): 200 Feet Assistive device: Rolling walker (2 wheeled)   Gait velocity: decreased Gait velocity interpretation: 1.31 - 2.62 ft/sec, indicative of limited community ambulator General Gait Details: Pt has  a bit of a trendelenberg gait pattern on her right, but reports that right hip gives her problems ever since her bone marrow bx was done over there.   Stairs Stairs: Yes Stairs assistance: Supervision Stair Management: One rail Right;Step to pattern;Forwards(with both hands on the rail) Number of Stairs: 4(limited by IV line) General stair comments: Pt reported she really doesn't have a "better" leg so she demonstrated how she does it at home wiht safe step to pattern, turned slightly so that both hands can be on the railing.          Balance Overall balance assessment: Needs assistance Sitting-balance support: Feet supported;Bilateral upper extremity supported Sitting balance-Leahy Scale: Good     Standing balance support: Bilateral upper extremity supported Standing balance-Leahy Scale: Fair                               Pertinent Vitals/Pain Pain Assessment: 0-10 Pain Score: 8  Pain Location: incisional Pain Descriptors / Indicators: Burning Pain Intervention(s): Limited activity within patient's tolerance;Monitored during session;Repositioned    Home Living Family/patient expects to be discharged to:: Private residence   Available Help at Discharge: Family Type of Home: House Home Access: Stairs to enter   Technical brewer of Steps: 1 Home Layout: Two level;Bed/bath upstairs        Prior Function Level of Independence: Independent         Comments: uses a walking stick outside  Extremity/Trunk Assessment   Upper Extremity Assessment Upper Extremity Assessment: Defer to OT evaluation    Lower Extremity Assessment Lower Extremity Assessment: LLE deficits/detail LLE Deficits / Details: left leg with some mild DF weakness (4/5), but per pt report this is better than before surgery.  LLE Sensation: WNL    Cervical / Trunk Assessment Cervical / Trunk Assessment: Other exceptions Cervical / Trunk Exceptions: post op lumbar  surgery  Communication   Communication: No difficulties  Cognition Arousal/Alertness: Awake/alert Behavior During Therapy: WFL for tasks assessed/performed Overall Cognitive Status: Within Functional Limits for tasks assessed                                               Assessment/Plan    PT Assessment Patient needs continued PT services  PT Problem List Decreased strength;Decreased activity tolerance;Decreased balance;Decreased mobility;Decreased knowledge of use of DME;Decreased knowledge of precautions;Pain       PT Treatment Interventions DME instruction;Gait training;Stair training;Functional mobility training;Therapeutic activities;Balance training;Therapeutic exercise;Patient/family education    PT Goals (Current goals can be found in the Care Plan section)  Acute Rehab PT Goals Patient Stated Goal: to go home, have her back feel better PT Goal Formulation: With patient Time For Goal Achievement: 10/17/17 Potential to Achieve Goals: Good    Frequency Min 5X/week           AM-PAC PT "6 Clicks" Daily Activity  Outcome Measure Difficulty turning over in bed (including adjusting bedclothes, sheets and blankets)?: A Little Difficulty moving from lying on back to sitting on the side of the bed? : A Little Difficulty sitting down on and standing up from a chair with arms (e.g., wheelchair, bedside commode, etc,.)?: A Little Help needed moving to and from a bed to chair (including a wheelchair)?: A Little Help needed walking in hospital room?: A Little Help needed climbing 3-5 steps with a railing? : None 6 Click Score: 19    End of Session   Activity Tolerance: Patient tolerated treatment well Patient left: in chair;with call bell/phone within reach Nurse Communication: Mobility status PT Visit Diagnosis: Muscle weakness (generalized) (M62.81);Difficulty in walking, not elsewhere classified (R26.2);Pain Pain - Right/Left: (lower) Pain - part of  body: (back)    Time: 1010-1039 PT Time Calculation (min) (ACUTE ONLY): 29 min   Charges:         Wells Guiles B. Jazleen Robeck, PT, DPT [phone]    PT Evaluation $PT Eval Moderate Complexity: 1 Mod PT Treatments $Gait Training: 8-22 mins   10/10/2017, 11:04 AM

## 2017-10-10 NOTE — Progress Notes (Signed)
AVS given and reviewed with pt. All questions answered to satisfaction. Printed prescriptions provided. Awaiting pt's daughter to arrive to take pt home. Pt to be escorted off the unit via wheelchair. Will continue to monitor.   1555: pt escorted off the unit by this RN via wheelchair

## 2017-10-10 NOTE — Progress Notes (Signed)
This Probation officer offered patient to walk with assistance but pt refused by saying "No,I'm scared"

## 2017-10-10 NOTE — Evaluation (Signed)
Occupational Therapy Evaluation Patient Details Name: Kristina Rowland MRN: [id number] DOB: 01-Mar-1951 Today's Date: 10/10/2017    History of Present Illness 67 y.o. female admitte on 10/09/17 for lumbar decompression L4-5.  Pt with significant PMH of HTN, HA, chronic hip pain , leukemia, bil knee arthroscopy.     Clinical Impression   Patient evaluated by Occupational Therapy with no further acute OT needs identified. All education has been completed and the patient has no further questions. See below for any follow-up Occupational Therapy or equipment needs. OT to sign off. Thank you for referral.    Follow Up Recommendations  No OT follow up    Equipment Recommendations  None recommended by OT    Recommendations for Other Services       Precautions / Restrictions Precautions Precautions: Back Precaution Booklet Issued: Yes (comment) Precaution Comments: handout reviewed for adls with back precautions      Mobility Bed Mobility Overal bed mobility: Needs Assistance Bed Mobility: Supine to Sit;Sit to Supine     Supine to sit: Modified independent (Device/Increase time) Sit to supine: Modified independent (Device/Increase time)   General bed mobility comments: completed task x3 with HOB flat no rails  Transfers Overall transfer level: Needs assistance Equipment used: None Transfers: Sit to/from Stand Sit to Stand: Supervision              Balance Overall balance assessment: Needs assistance Sitting-balance support: Feet supported;Bilateral upper extremity supported Sitting balance-Leahy Scale: Good                                     ADL either performed or assessed with clinical judgement   ADL Overall ADL's : Modified independent                                       General ADL Comments: educated on bed mobility with return demo, able to cross bil LE into a figure 4 cross, demonstrates shower transfer, educated on sitting  positoin. daughter present for all educatoin   Back handout provided and reviewed adls in detail. Pt educated on:  avoid sitting for long periods of time, correct bed positioning for sleeping, correct sequence for bed mobility, avoiding lifting more than 5 pounds and never wash directly over incision. All education is complete and patient indicates understanding. '  Vision   Vision Assessment?: No apparent visual deficits     Perception     Praxis      Pertinent Vitals/Pain Pain Assessment: 0-10 Pain Score: 5  Pain Location: incisional Pain Descriptors / Indicators: Burning Pain Intervention(s): Monitored during session;Premedicated before session;Repositioned     Hand Dominance Right   Extremity/Trunk Assessment Upper Extremity Assessment Upper Extremity Assessment: Overall WFL for tasks assessed   Lower Extremity Assessment Lower Extremity Assessment: Defer to PT evaluation   Cervical / Trunk Assessment Cervical / Trunk Assessment: Other exceptions Cervical / Trunk Exceptions: post op lumbar surgery   Communication Communication Communication: No difficulties   Cognition Arousal/Alertness: Awake/alert Behavior During Therapy: WFL for tasks assessed/performed Overall Cognitive Status: Within Functional Limits for tasks assessed                                     General Comments  incision dry  Exercises     Shoulder Instructions      Home Living Family/patient expects to be discharged to:: Private residence Living Arrangements: Spouse/significant other Available Help at Discharge: Family Type of Home: House Home Access: Stairs to enter Technical brewer of Steps: 1   Home Layout: Two level;Bed/bath upstairs Alternate Level Stairs-Number of Steps: flight Alternate Level Stairs-Rails: Right Bathroom Shower/Tub: Occupational psychologist: Standard         Additional Comments: large Higher education careers adviser"      Prior  Functioning/Environment Level of Independence: Independent        Comments: uses a walking stick outside        OT Problem List:        OT Treatment/Interventions:      OT Goals(Current goals can be found in the care plan section) Acute Rehab OT Goals Patient Stated Goal: to go home, have her back feel better OT Goal Formulation: With patient  OT Frequency:     Barriers to D/C:            Co-evaluation              AM-PAC PT "6 Clicks" Daily Activity     Outcome Measure Help from another person eating meals?: None Help from another person taking care of personal grooming?: None Help from another person toileting, which includes using toliet, bedpan, or urinal?: None Help from another person bathing (including washing, rinsing, drying)?: None Help from another person to put on and taking off regular upper body clothing?: None Help from another person to put on and taking off regular lower body clothing?: None 6 Click Score: 24   End of Session Equipment Utilized During Treatment: Gait belt Nurse Communication: Mobility status;Precautions  Activity Tolerance: Patient tolerated treatment well Patient left: in bed;with call bell/phone within reach;with family/visitor present                   Time: 0814-4818 OT Time Calculation (min): 40 min Charges:  OT General Charges $OT Visit: 1 Visit OT Evaluation $OT Eval Moderate Complexity: 1 Mod OT Treatments $Self Care/Home Management : 8-22 mins G-Codes:      Jeri Modena   OTR/L Pager: [phone] Office: [phone] .   Parke Poisson B 10/10/2017, 2:56 PM

## 2017-10-10 NOTE — Op Note (Signed)
NAME: Kristina Rowland, Kristina Rowland TD:[id number] ACCOUNT [id number] DATE OF BIRTH:1951/02/18 FACILITY: [facility] LOCATION: [facility] PHYSICIAN:Xylia Scherger Windy Kalata, MD  OPERATIVE REPORT  DATE OF PROCEDURE:  10/09/2017  PREOPERATIVE DIAGNOSIS:  Spinal stenosis at L4-5.  POSTOPERATIVE DIAGNOSIS:  Spinal stenosis at L4-5.  PROCEDURE PERFORMED:  Central microlumbar decompression L4-5 with bilateral laminotomies at L4-L5.  ANESTHESIA:  General.  ASSISTANT:  Lacie Draft, PA   HISTORY:  This is a 67 year old female with severe multifactorial stenosis at L4-5.  The patient has slight offset at 4, 5 as well.  We discussed a microlumbar decompression at L4-5.  She had bilateral radicular pain L5 nerve root distribution.  The  patient was indicated for decompression and possible insertion of the Coflex due to slight spondylolisthesis at L4-5, and ____ agrees that it is not entering the spinal fusion.  We discussed the risks and benefits including bleeding, infection, damage to  neurovascular structures, no change in symptoms or worsening symptoms, DVT, PE, anesthetic complications, need for fusion in the future, etc.  TECHNIQUE:  With the patient in supine position after induction of adequate general anesthesia and a gram of vancomycin, she was placed prone on the Wilson frame.  Foley to gravity.  All bony prominences well padded.  Lumbar region was prepped and draped  in the usual sterile fashion.  Two 18-gauge spinal needles were utilized to localize the 4-5 interspace, confirmed with x-ray.  An incision was made from the spinous process of 4 to 5.  Subcutaneous tissue was dissected with electrocautery utilized,  achieving hemostasis.  Marcaine 0.25% with epinephrine was infiltrated in the paraspinous musculature.  Dorsal lumbar fascia divided in line with skin incision.  Paraspinous muscle elevated from the lamina of 4 and 5.  McCulloch retractor was placed.   Confirmatory radiograph obtained at 4.  We  used a laminous.  There was a very small interlaminar window at L4-5.  I removed a portion of the spinous process of 4 and of 5.  We started essentially and continued cephalad to the point where we detached the  ligamentum flavum from the caudad edge of 4.  This was with a 2 mm Kerrison.  We continued cephalad.  Apparently, this decompression would require more than able to fit the Coflex in afterwards.  Detached ligamentum flavum from the cephalad edge of 5  with a micro straight curette.  Attention was turned towards the right side first where she had a majority of her stenosis.  We decompressed the lateral recess in the medial border of the pedicle.  We performed a foraminotomy of 5 on the right and 4.  We  detached the ligamentum flavum.  We then turned our attention to the left where she had a severe hypertrophic facet.  We removed the portion of the inferior process of 4.  Identified the superior articulating process of 5.  We skeletonized the recess.   Following this, in the hemilaminotomies of 5, I performed a foraminotomy of 5, and then with a Woodson retractor, protected the thecal sac and the 5 root laterally, and she had a severe hypertrophic ligamentum flavum.  This was on the right, compressing  the thecal sac into the ____.  Again, we enlarged the hemilaminotomy cephalad to remove the ligamentum flavum.  We then decompressed the lateral recess to the medial border of the pedicle with a 2 mm Kerrison.  Again, hypertrophic ligamentum flavum was  removed as well.  Care was taken not to retract the thecal sac at all.  Again, this was continued cephalad.  It was fairly hypertrophic in folded ligamentum on the left.  After the superior articulating process was decompressed of the pedicle, performed  a foraminotomy  at 4.  We placed bone wax throughout the case on the cancellous surface with generalized bleeding.  Bipolar electrocautery was utilized as well as thrombin-soaked Gelfoam.  There was good  restoration of the thecal sac following this  decompression.  With a Woodson retractor, it was placed cephalad bilaterally and caudad.  Then the probe passed freely out the foramen of 5 and 4.  Confirmatory radiograph was obtained with a probe in the foramen of 4 and 5.  Next, we achieved strict hemostasis.  There was no disk herniation that was determined.  The facets and the motion segment were very stable.  Meticulously achieved hemostasis.  I felt there was excellent decompression.  Next, copiously irrigated with  antibiotic irrigation.  Inspection revealed no evidence of CSF leakage or active bleeding.  I removed the McCulloch retractor and meticulously achieved hemostasis with bipolar electrocautery.  I then reapproximated the dorsal lumbar fascia with #1 Vicryl  interrupted figure-of-eight sutures.  I left a small aperture about a centimeter at the distal portion for any drainage for any hematoma.  The subcu was irrigated and closed with 2-0 and skin with staples.  Again, a small aperture distally.  A sterile dressing applied.  She was placed supine on the hospital bed, extubated without difficulty and transported to the recovery room in satisfactory condition.  The patient tolerated the procedure well.  No complications.  Assist:  Lacie Draft,  PA  Blood loss was 20 mL.  LN/NUANCE  D:10/09/2017 T:10/09/2017 JOB:[id number]

## 2017-10-10 NOTE — Discharge Summary (Signed)
Physician Discharge Summary   Patient ID: Kristina Rowland MRN: [id number] DOB/AGE: [birth year] 67 y.o.  Admit date: 10/09/2017 Discharge date: 10/10/2017  Primary Diagnosis:   Lumbar stenosis  Admission Diagnoses:  Past Medical History:  Diagnosis Date  . Arthritis   . Cancer ([place])    leukemia  . 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 Depression   . Headache   . Hypertension    Discharge Diagnoses:   Principal Problem:   Spinal stenosis of lumbar region Active Problems:   Spinal stenosis, lumbar  Procedure:  Procedure(s) (LRB): MICROLUMBAR DECOMPRESSION LUMBAR 4-5 (N/A)   Consults: None  HPI:  see H&P    Laboratory Data: Hospital Outpatient Visit on 10/03/2017  Component Date Value Ref Range Status  . Sodium 10/03/2017 138  135 - 145 mmol/L Final  . Potassium 10/03/2017 4.1  3.5 - 5.1 mmol/L Final  . Chloride 10/03/2017 102  101 - 111 mmol/L Final  . CO2 10/03/2017 25  22 - 32 mmol/L Final  . Glucose, Bld 10/03/2017 98  65 - 99 mg/dL Final  . BUN 10/03/2017 12  6 - 20 mg/dL Final  . Creatinine, Ser 10/03/2017 0.73  0.44 - 1.00 mg/dL Final  . Calcium 10/03/2017 8.9  8.9 - 10.3 mg/dL Final  . GFR calc non Af Amer 10/03/2017 >60  >60 mL/min Final  . GFR calc Af Amer 10/03/2017 >60  >60 mL/min Final   Comment: (NOTE) The eGFR has been calculated using the CKD EPI equation. This calculation has not been validated in all clinical situations. eGFR's persistently <60 mL/min signify possible Chronic Kidney Disease.   . Anion gap 10/03/2017 11  5 - 15 Final   Performed at [facility] [address] [place], [place] [address]  . WBC 10/03/2017 6.6  4.0 - 10.5 K/uL Final  . RBC 10/03/2017 4.88  3.87 - 5.11 MIL/uL Final  . Hemoglobin 10/03/2017 14.7  12.0 - 15.0 g/dL Final  . HCT 10/03/2017 43.5  36.0 - 46.0 % Final  . MCV 10/03/2017 89.1  78.0 - 100.0 fL Final  . MCH 10/03/2017 30.1  26.0 - 34.0 pg Final  . MCHC 10/03/2017 33.8   30.0 - 36.0 g/dL Final  . RDW 10/03/2017 13.0  11.5 - 15.5 % Final  . Platelets 10/03/2017 227  150 - 400 K/uL Final   Performed at [facility], [place] [address] [place], [place] [address]  . MRSA, PCR 10/03/2017 NEGATIVE  NEGATIVE Final  . Staphylococcus aureus 10/03/2017 NEGATIVE  NEGATIVE Final   Comment: (NOTE) The Xpert SA Assay (FDA approved for NASAL specimens in patients 77 years of age and older), is one component of a comprehensive surveillance program. It is not intended to diagnose infection nor to guide or monitor treatment. Performed at [facility], [place] [address], [place], [place] [address]    No results for input(s): HGB in the last 72 hours. No results for input(s): WBC, RBC, HCT, PLT in the last 72 hours. No results for input(s): NA, K, CL, CO2, BUN, CREATININE, GLUCOSE, CALCIUM in the last 72 hours. No results for input(s): LABPT, INR in the last 72 hours.  X-Rays:Dg Lumbar Spine 2-3 Views  Result Date: 10/09/2017 CLINICAL DATA:  Lumbar micro decompression at L4-5. EXAM: LUMBAR SPINE - 2-3 VIEW; DG C-ARM 61-120 MIN COMPARISON:  X-rays from 10/03/2017 FINDINGS: Same numbering scheme used for this study as on the x-ray exam  from 10/03/2017. 3 intraoperative spot fluoro films are submitted. First film demonstrates needles overlying the posterior soft tissues of the lower back. Level determination is not possible due to the small field of view. 2nd image obtained at 1729 hours. This shows soft tissue retractors in the lower back. Tip of a radiopaque surgical probe overlies the L5 posterior elements. 3rd image obtained at 1859 hours again shows posterior soft tissue retractors. 2 probes are evident. The more cranial of the 2 probes has the tip overlying a position just caudal to the L4 pedicles. The more caudal of the 2 probes has the tip overlying the L5 pedicles. IMPRESSION: Intraoperative localization during micro lumbar decompression at L4-5. Electronically Signed    By: Misty Stanley M.D.   On: 10/09/2017 20:28   Dg Lumbar Spine 2-3 Views  Result Date: 10/04/2017 CLINICAL DATA:  Preop for lumbar decompression of L4-5. EXAM: LUMBAR SPINE - 2-3 VIEW COMPARISON:  None. FINDINGS: Minimal grade 1 anterolisthesis of L4-5 is noted secondary to posterior facet joint hypertrophy. No fracture is noted. Moderate degenerative disc disease is noted at L1-2 and L2-3 with anterior osteophyte formation. Atherosclerosis of abdominal aorta is noted. IMPRESSION: Multilevel degenerative disc disease. No acute abnormality seen in the lumbar spine. Electronically Signed   By: Marijo Conception, M.D.   On: 10/04/2017 19:52   Dg C-arm 1-60 Min  Result Date: 10/09/2017 CLINICAL DATA:  Lumbar micro decompression at L4-5. EXAM: LUMBAR SPINE - 2-3 VIEW; DG C-ARM 61-120 MIN COMPARISON:  X-rays from 10/03/2017 FINDINGS: Same numbering scheme used for this study as on the x-ray exam from 10/03/2017. 3 intraoperative spot fluoro films are submitted. First film demonstrates needles overlying the posterior soft tissues of the lower back. Level determination is not possible due to the small field of view. 2nd image obtained at 1729 hours. This shows soft tissue retractors in the lower back. Tip of a radiopaque surgical probe overlies the L5 posterior elements. 3rd image obtained at 1859 hours again shows posterior soft tissue retractors. 2 probes are evident. The more cranial of the 2 probes has the tip overlying a position just caudal to the L4 pedicles. The more caudal of the 2 probes has the tip overlying the L5 pedicles. IMPRESSION: Intraoperative localization during micro lumbar decompression at L4-5. Electronically Signed   By: Misty Stanley M.D.   On: 10/09/2017 20:28   Dg C-arm 1-60 Min  Result Date: 10/09/2017 CLINICAL DATA:  Lumbar micro decompression at L4-5. EXAM: LUMBAR SPINE - 2-3 VIEW; DG C-ARM 61-120 MIN COMPARISON:  X-rays from 10/03/2017 FINDINGS: Same numbering scheme used for this  study as on the x-ray exam from 10/03/2017. 3 intraoperative spot fluoro films are submitted. First film demonstrates needles overlying the posterior soft tissues of the lower back. Level determination is not possible due to the small field of view. 2nd image obtained at 1729 hours. This shows soft tissue retractors in the lower back. Tip of a radiopaque surgical probe overlies the L5 posterior elements. 3rd image obtained at 1859 hours again shows posterior soft tissue retractors. 2 probes are evident. The more cranial of the 2 probes has the tip overlying a position just caudal to the L4 pedicles. The more caudal of the 2 probes has the tip overlying the L5 pedicles. IMPRESSION: Intraoperative localization during micro lumbar decompression at L4-5. Electronically Signed   By: Misty Stanley M.D.   On: 10/09/2017 20:28    EKG: Orders placed or performed in visit on 10/03/17  .  EKG 12-Lead  . EKG 12-Lead  . EKG 12-Lead     Hospital Course: Patient was admitted to [facility] and taken to the OR and underwent the above state procedure without complications.  Patient tolerated the procedure well and was later transferred to the recovery room and then to the orthopaedic floor for postoperative care.  They were given PO and IV analgesics for pain control following their surgery.  They were given 24 hours of postoperative antibiotics.   PT was consulted postop to assist with mobility and transfers.  The patient was allowed to be WBAT with therapy and was taught back precautions. Discharge planning was consulted to help with postop disposition and equipment needs.  Patient had a good night on the evening of surgery and started to get up OOB with therapy on day one. Patient was seen in rounds and was ready to go home on day one.  They were given discharge instructions and dressing directions.  They were instructed on when to follow up in the office with Dr. Tonita Cong.   Diet: Regular diet Activity:WBAT;  LSpine precautions Follow-up:in 10-14 days Disposition - Home Discharged Condition: good   Discharge Instructions    Call MD / Call 911   Complete by:  As directed    If you experience chest pain or shortness of breath, CALL 911 and be transported to the hospital emergency room.  If you develope a fever above 101 F, pus (white drainage) or increased drainage or redness at the wound, or calf pain, call your surgeon's office.   Constipation Prevention   Complete by:  As directed    Drink plenty of fluids.  Prune juice may be helpful.  You may use a stool softener, such as Colace (over the counter) 100 mg twice a day.  Use MiraLax (over the counter) for constipation as needed.   Diet - low sodium heart healthy   Complete by:  As directed    Increase activity slowly as tolerated   Complete by:  As directed      Allergies as of 10/10/2017      Reactions   Cephalosporins Anaphylaxis, Swelling, Rash   Has patient had a PCN reaction causing immediate rash, facial/tongue/throat swelling, SOB or lightheadedness with hypotension: Yes Has patient had a PCN reaction causing severe rash involving mucus membranes or skin necrosis: Yes Has patient had a PCN reaction that required hospitalization:W[place] Has patient had a PCN reaction occurring within the last 10 years: No If all of the above answers are "NO", then may proceed with Cephalosporin use.   Penicillins Anaphylaxis, Swelling, Rash   Has patient had a PCN reaction causing immediate rash, facial/tongue/throat swelling, SOB or lightheadedness with hypotension: Yes Has patient had a PCN reaction causing severe rash involving mucus membranes or skin necrosis: Yes Has patient had a PCN reaction that required hospitalization:No Has patient had a PCN reaction occurring within the last 10 years: No If all of the above answers are "NO", then may proceed with Cephalosporin use.      Medication List    STOP taking these  medications   Coconut Oil 1000 MG Caps   CoQ10 100 MG Caps   ibuprofen 200 MG tablet Commonly known as:  ADVIL,MOTRIN   simvastatin 40 MG tablet Commonly known as:  ZOCOR     TAKE these medications   acyclovir 400 MG tablet Commonly known as:  ZOVIRAX Take 400 mg by mouth 2 (two) times daily.  amphetamine-dextroamphetamine 25 MG 24 hr capsule Commonly known as:  ADDERALL XR Take 1 capsule by mouth daily.   docusate sodium 100 MG capsule Commonly known as:  COLACE Take 1 capsule (100 mg total) by mouth 2 (two) times daily as needed.   DULoxetine 60 MG capsule Commonly known as:  CYMBALTA Take 60 mg by mouth daily.   estradiol 1 MG tablet Commonly known as:  ESTRACE Take 0.3333 mg by mouth at bedtime.   lisinopril 20 MG tablet Commonly known as:  PRINIVIL,ZESTRIL Take 20 mg by mouth daily.   Magnesium 500 MG Tabs Take 500 mg by mouth at bedtime.   methocarbamol 500 MG tablet Commonly known as:  ROBAXIN Take 1 tablet (500 mg total) by mouth every 6 (six) hours as needed for muscle spasms.   OXYCONTIN 20 mg 12 hr tablet Generic drug:  oxyCODONE Take 20 mg by mouth every 12 (twelve) hours. What changed:  Another medication with the same name was added. Make sure you understand how and when to take each.   oxyCODONE 5 MG immediate release tablet Commonly known as:  ROXICODONE Take 1-2 tablets (5-10 mg total) by mouth every 4 (four) hours as needed for severe pain. What changed:  You were already taking a medication with the same name, and this prescription was added. Make sure you understand how and when to take each.   polyethylene glycol packet Commonly known as:  MIRALAX Take 17 g by mouth daily.   SUPER B COMPLEX/C PO Take 1 tablet by mouth daily at 2 PM.   Vitamin D3 5000 units Tabs Take 5,000 Units by mouth daily.      Follow-up Information    Susa Day, MD Follow up in 2 week(s).   Specialty:  Orthopedic Surgery Contact information: [address] [place] [address] [phone]           Signed: Lacie Draft, PA-C Orthopaedic Surgery 10/10/2017, 3:27 PM

## 2017-10-10 NOTE — Progress Notes (Signed)
Subjective: 1 Day Post-Op Procedure(s) (LRB): MICROLUMBAR DECOMPRESSION LUMBAR 4-5 (N/A) Patient reports pain as 3 on 0-10 scale.    Objective: Vital signs in last 24 hours: Temp:  [97.4 F (36.3 C)-98.3 F (36.8 C)] 97.6 F (36.4 C) (05/10 0445) Pulse Rate:  [82-115] 113 (05/10 0445) Resp:  [14-29] 18 (05/10 0445) BP: (101-149)/(42-82) 101/42 (05/10 0445) SpO2:  [91 %-96 %] 93 % (05/10 0445)  Intake/Output from previous day: 05/09 0701 - 05/10 0700 In: 1900 [I.V.:1900] Out: 690 [Urine:90; Blood:600] Intake/Output this shift: Total I/O In: 900 [I.V.:900] Out: 50 [Urine:50]  No results for input(s): HGB in the last 72 hours. No results for input(s): WBC, RBC, HCT, PLT in the last 72 hours. No results for input(s): NA, K, CL, CO2, BUN, CREATININE, GLUCOSE, CALCIUM in the last 72 hours. No results for input(s): LABPT, INR in the last 72 hours.  Neurologically intact Sensation intact distally Dorsiflexion/Plantar flexion intact Incision: dressing C/D/I    Assessment/Plan: 1 Day Post-Op Procedure(s) (LRB): MICROLUMBAR DECOMPRESSION LUMBAR 4-5 (N/A) Advance diet Up with therapy D/C IV fluids Plan for discharge tomorrow  Doing well. Lives alone. No one to drive her home. May need transportation. Discussed OR    Kristina Rowland C 10/10/2017, 6:51 AM

## 2019-02-06 IMAGING — RF DG C-ARM 61-120 MIN
1 series · 3 of 3 positions shown · non-contrast
Comparison: X-rays from 10/03/2017

CLINICAL DATA: Lumbar micro decompression at L4-5.

EXAM:
LUMBAR SPINE - 2-3 VIEW; DG C-ARM 61-120 MIN

[Series 1: run · 3 of 3 slices shown]
[im 1/3]
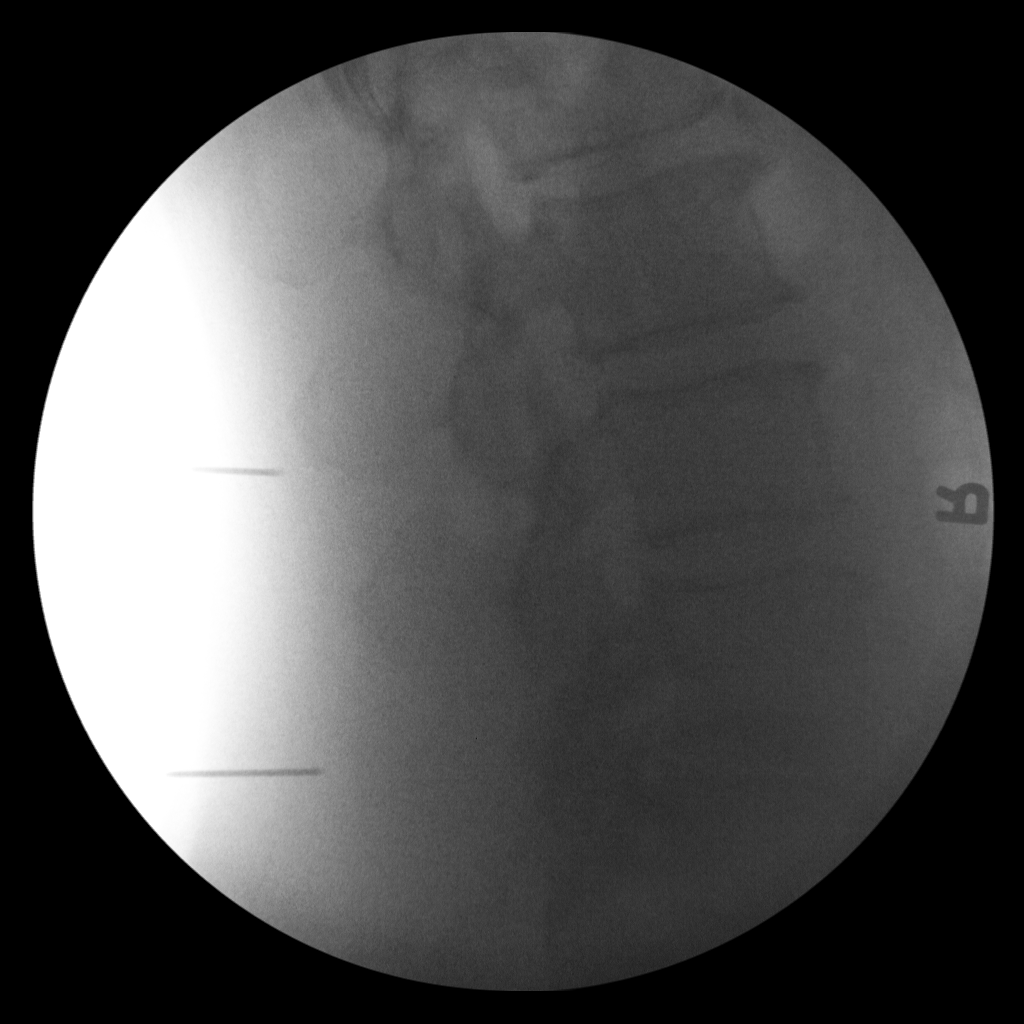
[im 2/3]
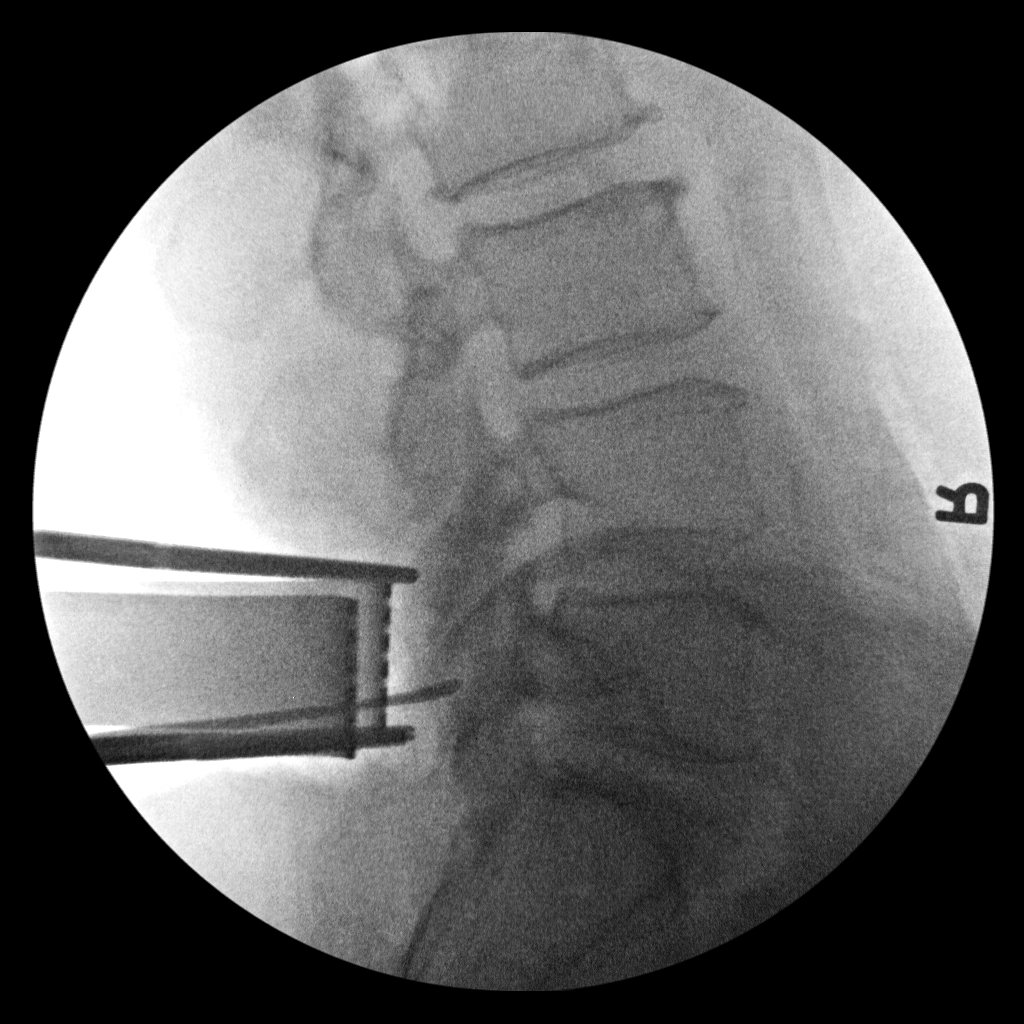
[im 3/3]
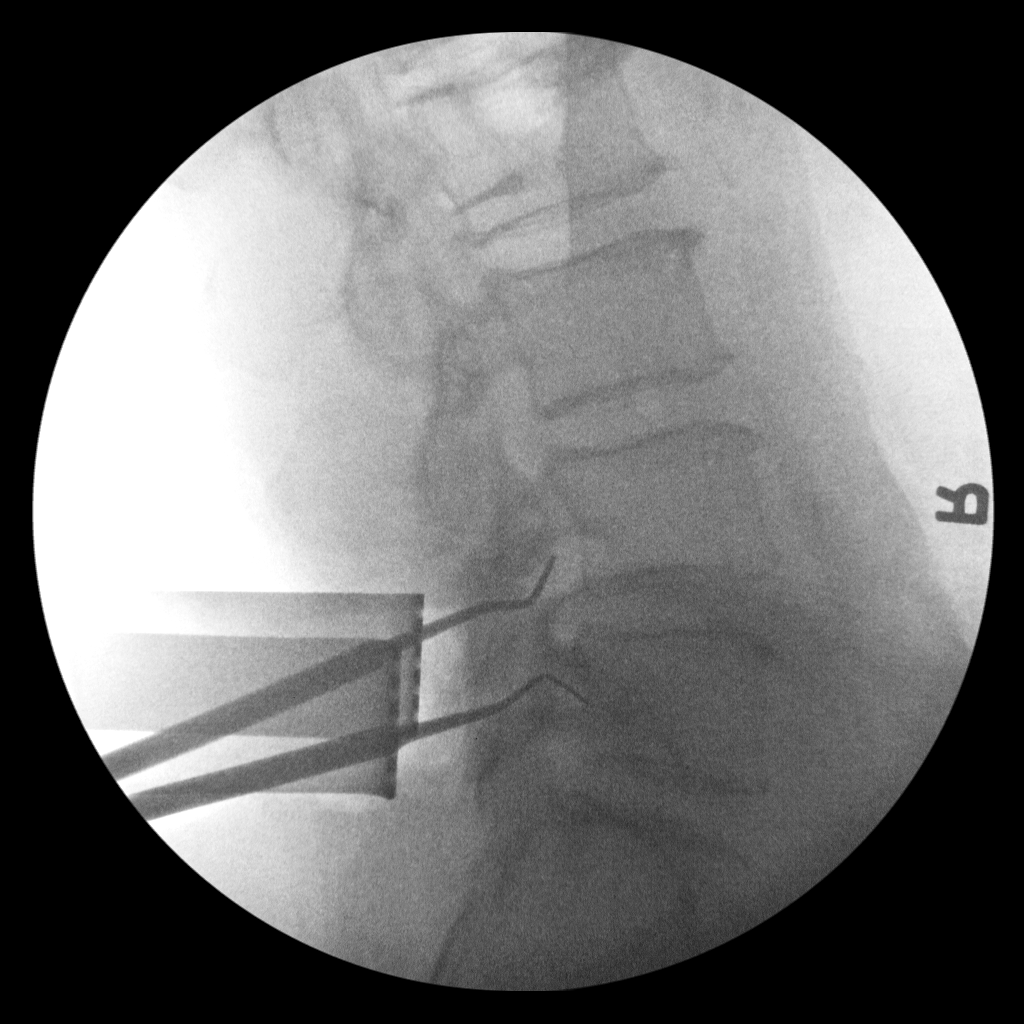

[3 of 3 positions shown; findings below may reference images not displayed]

FINDINGS: Same numbering scheme used for this study as on the x-ray exam from
10/03/2017. 3 intraoperative spot fluoro films are submitted. First
film demonstrates needles overlying the posterior soft tissues of
the lower back. Level determination is not possible due to the small
field of view.

2nd image obtained at 9567 hours. This shows soft tissue retractors
in the lower back. Tip of a radiopaque surgical probe overlies the
L5 posterior elements.

3rd image obtained at 8734 hours again shows posterior soft tissue
retractors. 2 probes are evident. The more cranial of the 2 probes
has the tip overlying a position just caudal to the L4 pedicles. The
more caudal of the 2 probes has the tip overlying the L5 pedicles.
IMPRESSION: Intraoperative localization during micro lumbar decompression at
L4-5.

## 2019-07-05 HISTORY — PX: JOINT REPLACEMENT: SHX530

## 2019-07-19 NOTE — Patient Instructions (Signed)
DUE TO COVID-19 ONLY ONE VISITOR IS ALLOWED TO COME WITH YOU AND STAY IN THE WAITING ROOM ONLY DURING PRE OP AND PROCEDURE DAY OF SURGERY. THE 1 VISITOR MAY VISIT WITH YOU AFTER SURGERY IN YOUR PRIVATE ROOM DURING VISITING HOURS ONLY!  YOU NEED TO HAVE A COVID 19 TEST ON_2/18/21______ @_11 :30______, THIS TEST MUST BE DONE BEFORE SURGERY, COME  [address] [place] , [id number].  ([place]) ONCE YOUR COVID TEST IS COMPLETED, PLEASE BEGIN THE QUARANTINE INSTRUCTIONS AS OUTLINED IN YOUR HANDOUT.                Kristina Rowland    Your procedure is scheduled on:  07/26/19   Report to [facility] Main  Entrance   Report to admitting at 9:00 AM     Call this number if you have problems the morning of surgery [phone]   . BRUSH YOUR TEETH MORNING OF SURGERY AND RINSE YOUR MOUTH OUT, NO CHEWING GUM CANDY OR MINTS.    Do not eat food After Midnight  . YOU MAY HAVE CLEAR LIQUIDS FROM MIDNIGHT UNTIL 8:30 AM.   At 8:30  AM Please finish the prescribed Pre-Surgery  Drink.    Nothing by mouth after you finish the  drink !   Take these medicines the morning of surgery with A SIP OF WATER: Cymbalta                                 You may not have any metal on your body including hair pins and              piercings  Do not wear jewelry, make-up, lotions, powders or perfumes, deodorant             Do not wear nail polish on your fingernails.  Do not shave  48 hours prior to surgery.     Do not bring valuables to the hospital. [place].  Contacts, dentures or bridgework may not be worn into surgery.       Special Instructions: N/A              Please read over the following fact sheets you were given: _____________________________________________________________________             [facility] - Preparing for Surgery  Before surgery, you can play an important role.   Because skin is not sterile,  your skin needs to be as free of germs as possible.   You can reduce the number of germs on your skin by washing with CHG (chlorahexidine gluconate) soap before surgery .  CHG is an antiseptic cleaner which kills germs and bonds with the skin to continue killing germs even after washing. Please DO NOT use if you have an allergy to CHG or antibacterial soaps.   If your skin becomes reddened/irritated stop using the CHG and inform your nurse when you arrive at Short Stay. Do not shave (including legs and underarms) for at least 48 hours prior to the first CHG shower.    Please follow these instructions carefully:  1.  Shower with CHG Soap the night before surgery and the  morning of Surgery.  2.  If you choose to wash your hair, wash your hair first as usual with your  normal  shampoo.  3.  After you shampoo, rinse your hair and body thoroughly to remove the  shampoo.                                        4.  Use CHG as you would any other liquid soap.  You can apply chg directly  to the skin and wash                       Gently with a scrungie or clean washcloth.  5.  Apply the CHG Soap to your body ONLY FROM THE NECK DOWN.   Do not use on face/ open                           Wound or open sores. Avoid contact with eyes, ears mouth and genitals (private parts).                       Wash face,  Genitals (private parts) with your normal soap.             6.  Wash thoroughly, paying special attention to the area where your surgery  will be performed.  7.  Thoroughly rinse your body with warm water from the neck down.  8.  DO NOT shower/wash with your normal soap after using and rinsing off  the CHG Soap.             9.  Pat yourself dry with a clean towel.            10.  Wear clean pajamas.            11.  Place clean sheets on your bed the night of your first shower and do not  sleep with pets. Day of Surgery : Do not apply any lotions/deodorants the morning of surgery.  Please wear clean  clothes to the hospital/surgery center.  FAILURE TO FOLLOW THESE INSTRUCTIONS MAY RESULT IN THE CANCELLATION OF YOUR SURGERY PATIENT SIGNATURE_________________________________  NURSE SIGNATURE__________________________________  ________________________________________________________________________   Kristina Rowland  An incentive spirometer is a tool that can help keep your lungs clear and active. This tool measures how well you are filling your lungs with each breath. Taking long deep breaths may help reverse or decrease the chance of developing breathing (pulmonary) problems (especially infection) following:  A long period of time when you are unable to move or be active. BEFORE THE PROCEDURE   If the spirometer includes an indicator to show your best effort, your nurse or respiratory therapist will set it to a desired goal.  If possible, sit up straight or lean slightly forward. Try not to slouch.  Hold the incentive spirometer in an upright position. INSTRUCTIONS FOR USE  1. Sit on the edge of your bed if possible, or sit up as far as you can in bed or on a chair. 2. Hold the incentive spirometer in an upright position. 3. Breathe out normally. 4. Place the mouthpiece in your mouth and seal your lips tightly around it. 5. Breathe in slowly and as deeply as possible, raising the piston or the ball toward the top of the column. 6. Hold your breath for 3-5 seconds or for as long as possible. Allow the piston or ball to fall to the bottom of the column. 7.  Remove the mouthpiece from your mouth and breathe out normally. 8. Rest for a few seconds and repeat Steps 1 through 7 at least 10 times every 1-2 hours when you are awake. Take your time and take a few normal breaths between deep breaths. 9. The spirometer may include an indicator to show your best effort. Use the indicator as a goal to work toward during each repetition. 10. After each set of 10 deep breaths, practice  coughing to be sure your lungs are clear. If you have an incision (the cut made at the time of surgery), support your incision when coughing by placing a pillow or rolled up towels firmly against it. Once you are able to get out of bed, walk around indoors and cough well. You may stop using the incentive spirometer when instructed by your caregiver.  RISKS AND COMPLICATIONS  Take your time so you do not get dizzy or light-headed.  If you are in pain, you may need to take or ask for pain medication before doing incentive spirometry. It is harder to take a deep breath if you are having pain. AFTER USE  Rest and breathe slowly and easily.  It can be helpful to keep track of a log of your progress. Your caregiver can provide you with a simple table to help with this. If you are using the spirometer at home, follow these instructions: [place] IF:   You are having difficultly using the spirometer.  You have trouble using the spirometer as often as instructed.  Your pain medication is not giving enough relief while using the spirometer.  You develop fever of 100.5 F (38.1 C) or higher. SEEK IMMEDIATE MEDICAL CARE IF:   You cough up bloody sputum that had not been present before.  You develop fever of 102 F (38.9 C) or greater.  You develop worsening pain at or near the incision site. MAKE SURE YOU:   Understand these instructions.  Will watch your condition.  Will get help right away if you are not doing well or get worse. Document Released: 09/30/2006 Document Revised: 08/12/2011 Document Reviewed: 12/01/2006 ExitCare Patient Information 2014 Exit[employer], [place].   ________________________________________________________________________  WHAT IS A BLOOD TRANSFUSION? Blood Transfusion Information  A transfusion is the replacement of blood or some of its parts. Blood is made up of multiple cells which provide different functions.  Red blood cells carry oxygen and are  used for blood loss replacement.  White blood cells fight against infection.  Platelets control bleeding.  Plasma helps clot blood.  Other blood products are available for specialized needs, such as hemophilia or other clotting disorders. BEFORE THE TRANSFUSION  Who gives blood for transfusions?   Healthy volunteers who are fully evaluated to make sure their blood is safe. This is blood bank blood. Transfusion therapy is the safest it has ever been in the practice of medicine. Before blood is taken from a donor, a complete history is taken to make sure that person has no history of diseases nor engages in risky social behavior (examples are intravenous drug use or sexual activity with multiple partners). The donor's travel history is screened to minimize risk of transmitting infections, such as malaria. The donated blood is tested for signs of infectious diseases, such as HIV and hepatitis. The blood is then tested to be sure it is compatible with you in order to minimize the chance of a transfusion reaction. If you or a relative donates blood, this is often done in anticipation  of surgery and is not appropriate for emergency situations. It takes many days to process the donated blood. RISKS AND COMPLICATIONS Although transfusion therapy is very safe and saves many lives, the main dangers of transfusion include:   Getting an infectious disease.  Developing a transfusion reaction. This is an allergic reaction to something in the blood you were given. Every precaution is taken to prevent this. The decision to have a blood transfusion has been considered carefully by your caregiver before blood is given. Blood is not given unless the benefits outweigh the risks. AFTER THE TRANSFUSION  Right after receiving a blood transfusion, you will usually feel much better and more energetic. This is especially true if your red blood cells have gotten low (anemic). The transfusion raises the level of the red  blood cells which carry oxygen, and this usually causes an energy increase.  The nurse administering the transfusion will monitor you carefully for complications. HOME CARE INSTRUCTIONS  No special instructions are needed after a transfusion. You may find your energy is better. Speak with your caregiver about any limitations on activity for underlying diseases you may have. SEEK MEDICAL CARE IF:   Your condition is not improving after your transfusion.  You develop redness or irritation at the intravenous (IV) site. SEEK IMMEDIATE MEDICAL CARE IF:  Any of the following symptoms occur over the next 12 hours:  Shaking chills.  You have a temperature by mouth above 102 F (38.9 C), not controlled by medicine.  Chest, back, or muscle pain.  People around you feel you are not acting correctly or are confused.  Shortness of breath or difficulty breathing.  Dizziness and fainting.  You get a rash or develop hives.  You have a decrease in urine output.  Your urine turns a dark color or changes to pink, red, or brown. Any of the following symptoms occur over the next 10 days:  You have a temperature by mouth above 102 F (38.9 C), not controlled by medicine.  Shortness of breath.  Weakness after normal activity.  The white part of the eye turns yellow (jaundice).  You have a decrease in the amount of urine or are urinating less often.  Your urine turns a dark color or changes to pink, red, or brown. Document Released: 05/17/2000 Document Revised: 08/12/2011 Document Reviewed: 01/04/2008 [facility] Patient Information 2014 [place], [place].  _______________________________________________________________________

## 2019-07-21 ENCOUNTER — Other Ambulatory Visit: Payer: Self-pay

## 2019-07-21 ENCOUNTER — Encounter (HOSPITAL_COMMUNITY): Payer: Self-pay

## 2019-07-21 ENCOUNTER — Other Ambulatory Visit (HOSPITAL_COMMUNITY)
Admission: RE | Admit: 2019-07-21 | Discharge: 2019-07-21 | Disposition: A | Discharge status: Home / Self Care | Payer: Medicare Other | Source: Ambulatory Visit | Attending: Orthopedic Surgery | Admitting: Orthopedic Surgery

## 2019-07-21 ENCOUNTER — Encounter (HOSPITAL_COMMUNITY)
Admission: RE | Admit: 2019-07-21 | Discharge: 2019-07-21 | Disposition: A | Discharge status: Home / Self Care | Payer: Medicare Other | Source: Ambulatory Visit | Attending: Orthopedic Surgery | Admitting: Orthopedic Surgery

## 2019-07-21 DIAGNOSIS — Z01812 Encounter for preprocedural laboratory examination: Secondary | ICD-10-CM | POA: Insufficient documentation

## 2019-07-21 DIAGNOSIS — I10 Essential (primary) hypertension: Secondary | ICD-10-CM | POA: Diagnosis not present

## 2019-07-21 DIAGNOSIS — Z20822 Contact with and (suspected) exposure to covid-19: Secondary | ICD-10-CM | POA: Diagnosis not present

## 2019-07-21 LAB — CBC
HCT: 43.9 % (ref 36.0–46.0)
Hemoglobin: 14.6 g/dL (ref 12.0–15.0)
MCH: 29.4 pg (ref 26.0–34.0)
MCHC: 33.3 g/dL (ref 30.0–36.0)
MCV: 88.5 fL (ref 80.0–100.0)
Platelets: 234 10*3/uL (ref 150–400)
RBC: 4.96 MIL/uL (ref 3.87–5.11)
RDW: 12.9 % (ref 11.5–15.5)
WBC: 5.7 10*3/uL (ref 4.0–10.5)
nRBC: 0 % (ref 0.0–0.2)

## 2019-07-21 LAB — PROTIME-INR
INR: 0.9 (ref 0.8–1.2)
Prothrombin Time: 12.4 seconds (ref 11.4–15.2)

## 2019-07-21 LAB — COMPREHENSIVE METABOLIC PANEL
ALT: 17 U/L (ref 0–44)
AST: 17 U/L (ref 15–41)
Albumin: 4 g/dL (ref 3.5–5.0)
Alkaline Phosphatase: 84 U/L (ref 38–126)
Anion gap: 10 (ref 5–15)
BUN: 14 mg/dL (ref 8–23)
CO2: 22 mmol/L (ref 22–32)
Calcium: 8.9 mg/dL (ref 8.9–10.3)
Chloride: 108 mmol/L (ref 98–111)
Creatinine, Ser: 0.64 mg/dL (ref 0.44–1.00)
GFR calc Af Amer: >60 mL/min (ref >60)
GFR calc non Af Amer: >60 mL/min (ref >60)
Glucose, Bld: 99 mg/dL (ref 70–99)
Potassium: 3.5 mmol/L (ref 3.5–5.1)
Sodium: 140 mmol/L (ref 135–145)
Total Bilirubin: 1.1 mg/dL (ref 0.3–1.2)
Total Protein: 6.8 g/dL (ref 6.5–8.1)

## 2019-07-21 LAB — ABO/RH: ABO/RH(D): O POS

## 2019-07-21 LAB — SURGICAL PCR SCREEN
MRSA, PCR: NEGATIVE
Staphylococcus aureus: NEGATIVE

## 2019-07-21 LAB — SARS CORONAVIRUS 2 (TAT 6-24 HRS): SARS Coronavirus 2: NEGATIVE

## 2019-07-21 LAB — APTT: aPTT: 29 seconds (ref 24–36)

## 2019-07-21 NOTE — H&P (Signed)
TOTAL KNEE ADMISSION H&P  Patient is being admitted for left total knee arthroplasty.  Subjective:  Chief Complaint:left knee pain.  HPI: Kristina Rowland, 69 y.o. female, has a history of pain and functional disability in the left knee due to arthritis and has failed non-surgical conservative treatments for greater than 12 weeks to includecorticosteriod injections and activity modification.  Onset of symptoms was gradual, starting >10 years ago with gradually worsening course since that time. The patient noted prior procedures on the knee to include  arthroscopy and menisectomy on the left knee(s).  Patient currently rates pain in the left knee(s) at 8 out of 10 with activity. Patient has worsening of pain with activity and weight bearing and pain that interferes with activities of daily living.  Patient has evidence of joint space narrowing by imaging studies. There is no active infection.  Patient Active Problem List   Diagnosis Date Noted  . Spinal stenosis of lumbar region 10/09/2017  . Spinal stenosis, lumbar 10/09/2017   Past Medical History:  Diagnosis Date  . Arthritis   . Cancer ([place])    leukemia  . 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 Depression   . Headache   . Hypertension     Past Surgical History:  Procedure Laterality Date  . ABDOMINAL HYSTERECTOMY    . APPENDECTOMY    . BONE MARROW HARVEST    . CATARACT EXTRACTION, BILATERAL    . [place]  . KNEE ARTHROSCOPY WITH MENISCAL REPAIR     left and right  . LUMBAR LAMINECTOMY/DECOMPRESSION MICRODISCECTOMY N/A 10/09/2017   Procedure: MICROLUMBAR DECOMPRESSION LUMBAR 4-5;  Surgeon: Susa Day, MD;  Location: [facility];  Service: Orthopedics;  Laterality: N/A;  . TONSILLECTOMY      No current facility-administered medications for this encounter.   Current Outpatient Medications  Medication Sig Dispense Refill Last Dose  . acyclovir (ZOVIRAX) 800 MG tablet Take 800 mg  by mouth 4 (four) times daily as needed (fever blisters.).      Marland Kitchen amphetamine-dextroamphetamine (ADDERALL XR) 25 MG 24 hr capsule Take 25 mg by mouth daily.   0   . Biotin 10000 MCG TABS Take 10,000 mcg by mouth daily.     . Buprenorphine HCl-Naloxone HCl 2-0.5 MG FILM Place 0.125 strips under the tongue at bedtime. Take 1/8 of a strip     . Cholecalciferol (VITAMIN D3) 250 MCG (10000 UT) TABS Take 10,000 Units by mouth daily.     . DULoxetine (CYMBALTA) 60 MG capsule Take 60 mg by mouth daily.     Marland Kitchen estradiol (ESTRACE) 1 MG tablet Take 0.3333 mg by mouth at bedtime.     Marland Kitchen lisinopril (PRINIVIL,ZESTRIL) 20 MG tablet Take 20 mg by mouth at bedtime.   1   . topiramate (TOPAMAX) 25 MG tablet Take 25 mg by mouth at bedtime.      Allergies  Allergen Reactions  . Cephalosporins Anaphylaxis, Swelling and Rash    Has patient had a PCN reaction causing immediate rash, facial/tongue/throat swelling, SOB or lightheadedness with hypotension: Yes Has patient had a PCN reaction causing severe rash involving mucus membranes or skin necrosis: Yes Has patient had a PCN reaction that required hospitalization:WAS [place] Has patient had a PCN reaction occurring within the last 10 years: No If all of the above answers are "NO", then may proceed with Cephalosporin use.   Marland Kitchen Penicillins Anaphylaxis, Swelling and Rash  Has patient had a PCN reaction causing immediate rash, facial/tongue/throat swelling, SOB or lightheadedness with hypotension: Yes Has patient had a PCN reaction causing severe rash involving mucus membranes or skin necrosis: Yes Has patient had a PCN reaction that required hospitalization:No Has patient had a PCN reaction occurring within the last 10 years: No If all of the above answers are "NO", then may proceed with Cephalosporin use.      Social History   Tobacco Use  . Smoking status: Former Smoker    Types: Cigarettes    Quit date: 1986    Years since  quitting: 35.1  . Smokeless tobacco: Never Used  Substance Use Topics  . Alcohol use: Yes    Comment: rarely    No family history on file.   Review of Systems  Constitutional: Negative for chills and fever.  Respiratory: Negative for cough and shortness of breath.   Gastrointestinal: Negative for nausea and vomiting.  Musculoskeletal: Positive for arthralgias.    Objective:  Physical Exam Patient is a 69 year old female.  Well nourished and well developed. General: Alert and oriented x3, cooperative and pleasant, no acute distress. Head: normocephalic, atraumatic, neck supple. Eyes: EOMI. Respiratory: breath sounds clear in all fields, no wheezing, rales, or rhonchi. Cardiovascular: Regular rate and rhythm, no murmurs, gallops or rubs. Abdomen: non-tender to palpation and soft, normoactive bowel sounds.  Musculoskeletal: Left Knee: Mild tenderness to palpation about the medial joint line of the left knee. AROM 5-125 degrees. No effusion noted. No instability. Moderate patellofemoral crepitus. Sensation and motor function intact in LE. Distal pulses 2+. Calves soft and nontender.  Calves soft and nontender. Motor function intact in LE. Strength 5/5 LE bilaterally. Neuro: Distal pulses 2+. Sensation to light touch intact in LE.  Vital signs in last 24 hours: Labs:   Estimated body mass index is 36.69 kg/m as calculated from the following:   Height as of 10/03/17: 5\' 5"  (1.651 m).   Weight as of 10/03/17: 100 kg.   Imaging Review Plain radiographs demonstrate severe degenerative joint disease of the left knee(s). The overall alignment isneutral. The bone quality appears to be adequate for age and reported activity level.  Assessment/Plan:  End stage arthritis, left knee   The patient history, physical examination, clinical judgment of the provider and imaging studies are consistent with end stage degenerative joint disease of the left knee(s) and total knee arthroplasty  is deemed medically necessary. The treatment options including medical management, injection therapy arthroscopy and arthroplasty were discussed at length. The risks and benefits of total knee arthroplasty were presented and reviewed. The risks due to aseptic loosening, infection, stiffness, patella tracking problems, thromboembolic complications and other imponderables were discussed. The patient acknowledged the explanation, agreed to proceed with the plan and consent was signed. Patient is being admitted for inpatient treatment for surgery, pain control, PT, OT, prophylactic antibiotics, VTE prophylaxis, progressive ambulation and ADL's and discharge planning. The patient is planning to be discharged home.  Therapy Plans: outpatient therapy at [facility] in [place], [place] Disposition: Home with husband Planned DVT Prophylaxis: aspirin 325mg  BID (hx of leukemia in 1991) DME needed: walker PCP: Nash Mantis, awaiting clearance TXA: IV Allergies: cephalosporins - facial swelling, Codeine - vomiting , PCN - hives, facial flushing Anesthesia Concerns: none BMI: 33.3 Not diabetic.  Other: -Taking Buprenorphine patches, 1/8th of a patch daily. -Previously on Plavix for "blockages in her legs" but was taken off of this recently. -Hx of AML leukemia in 1991. 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 -Patient has a 2 hour drive home, would prefer to stay the night.  **No gabapentin  Anticipated LOS equal to or greater than 2 midnights due to - Age 66 and older with one or more of the following:  - Obesity  - Expected need for hospital services (PT, OT, Nursing) required for safe  discharge  - Anticipated need for postoperative skilled nursing care or inpatient rehab  - Active co-morbidities: Chronic pain requiring opiods OR   - Unanticipated findings during/Post Surgery: None  - Patient is a high risk of re-admission due to: None  - Patient was  instructed on what medications to stop prior to surgery. - Follow-up visit in 2 weeks with Dr. Wynelle Link - Begin physical therapy following surgery - Pre-operative lab work as pre-surgical testing - Prescriptions will be provided in hospital at time of discharge  Griffith Citron, PA-C Orthopedic Surgery [facility] [phone]

## 2019-07-21 NOTE — Progress Notes (Signed)
PCP - Dr. Raquel Sarna Cardiologist -none   Chest x-ray - no EKG - 07/21/19 Stress Test - no ECHO - no Cardiac Cath - no  Sleep Study - no CPAP -   Fasting Blood Sugar - NA Checks Blood Sugar _____ times a day  Blood Thinner Instructions:NA Aspirin Instructions: Last Dose:  Anesthesia review:   Patient denies shortness of breath, fever, cough and chest pain at PAT appointment yes  Patient verbalized understanding of instructions that were given to them at the PAT appointment. Patient was also instructed that they will need to review over the PAT instructions again at home before surgery. yes

## 2019-07-21 NOTE — Progress Notes (Signed)
Dr. Gifford Shave stated that patient could be tested 5 days prior to her procedure due to her living in [place], [place].  Patient was tested today for her procedure on Monday 2/22.  She is aware that she needs to quarantine until that procedure on monday

## 2019-07-22 ENCOUNTER — Encounter (HOSPITAL_COMMUNITY): Admission: RE | Admit: 2019-07-22 | Payer: Medicare Other | Source: Ambulatory Visit

## 2019-07-22 ENCOUNTER — Other Ambulatory Visit (HOSPITAL_COMMUNITY): Payer: Medicare Other

## 2019-07-25 MED ORDER — BUPIVACAINE LIPOSOME 1.3 % IJ SUSP
20.0000 mL | INTRAMUSCULAR | Status: DC
  Filled 2019-07-25: qty 20

## 2019-07-26 ENCOUNTER — Encounter (HOSPITAL_COMMUNITY): Payer: Self-pay | Admitting: Orthopedic Surgery

## 2019-07-26 ENCOUNTER — Encounter (HOSPITAL_COMMUNITY)
Admission: RE | Disposition: A | Discharge status: Home / Self Care | Payer: Self-pay | Source: Ambulatory Visit | Attending: Orthopedic Surgery

## 2019-07-26 ENCOUNTER — Inpatient Hospital Stay (HOSPITAL_COMMUNITY): Payer: Medicare Other | Admitting: Physician Assistant

## 2019-07-26 ENCOUNTER — Inpatient Hospital Stay (HOSPITAL_COMMUNITY): Payer: Medicare Other | Admitting: Certified Registered Nurse Anesthetist

## 2019-07-26 ENCOUNTER — Other Ambulatory Visit: Payer: Self-pay

## 2019-07-26 ENCOUNTER — Observation Stay (HOSPITAL_COMMUNITY)
Admission: RE | Admit: 2019-07-26 | Discharge: 2019-07-27 | Disposition: A | Discharge status: Home / Self Care | Payer: Medicare Other | Source: Ambulatory Visit | Attending: Orthopedic Surgery | Admitting: Orthopedic Surgery

## 2019-07-26 DIAGNOSIS — G8929 Other chronic pain: Secondary | ICD-10-CM | POA: Insufficient documentation

## 2019-07-26 DIAGNOSIS — Z79899 Other long term (current) drug therapy: Secondary | ICD-10-CM | POA: Insufficient documentation

## 2019-07-26 DIAGNOSIS — Z96652 Presence of left artificial knee joint: Secondary | ICD-10-CM | POA: Diagnosis present

## 2019-07-26 DIAGNOSIS — Z856 Personal history of leukemia: Secondary | ICD-10-CM | POA: Diagnosis not present

## 2019-07-26 DIAGNOSIS — Z6834 Body mass index (BMI) 34.0-34.9, adult: Secondary | ICD-10-CM | POA: Diagnosis not present

## 2019-07-26 DIAGNOSIS — M1712 Unilateral primary osteoarthritis, left knee: Principal | ICD-10-CM | POA: Insufficient documentation

## 2019-07-26 DIAGNOSIS — M48061 Spinal stenosis, lumbar region without neurogenic claudication: Secondary | ICD-10-CM | POA: Diagnosis not present

## 2019-07-26 DIAGNOSIS — E669 Obesity, unspecified: Secondary | ICD-10-CM | POA: Diagnosis not present

## 2019-07-26 DIAGNOSIS — M179 Osteoarthritis of knee, unspecified: Secondary | ICD-10-CM | POA: Diagnosis present

## 2019-07-26 DIAGNOSIS — F329 Major depressive disorder, single episode, unspecified: Secondary | ICD-10-CM | POA: Diagnosis not present

## 2019-07-26 DIAGNOSIS — M1710 Unilateral primary osteoarthritis, unspecified knee: Secondary | ICD-10-CM | POA: Diagnosis present

## 2019-07-26 DIAGNOSIS — I10 Essential (primary) hypertension: Secondary | ICD-10-CM | POA: Insufficient documentation

## 2019-07-26 DIAGNOSIS — M25559 Pain in unspecified hip: Secondary | ICD-10-CM | POA: Diagnosis not present

## 2019-07-26 DIAGNOSIS — Z20822 Contact with and (suspected) exposure to covid-19: Secondary | ICD-10-CM | POA: Insufficient documentation

## 2019-07-26 DIAGNOSIS — Z87891 Personal history of nicotine dependence: Secondary | ICD-10-CM | POA: Insufficient documentation

## 2019-07-26 HISTORY — PX: TOTAL KNEE ARTHROPLASTY: SHX125

## 2019-07-26 LAB — TYPE AND SCREEN
ABO/RH(D): O POS
Antibody Screen: NEGATIVE

## 2019-07-26 SURGERY — ARTHROPLASTY, KNEE, TOTAL
Anesthesia: Monitor Anesthesia Care | Site: Knee | Laterality: Left

## 2019-07-26 MED ORDER — AMPHETAMINE-DEXTROAMPHET ER 5 MG PO CP24
25.0000 mg | ORAL_CAPSULE | Freq: Every day | ORAL | Status: DC
  Administered 2019-07-27: 25 mg via ORAL
  Filled 2019-07-26: qty 1

## 2019-07-26 MED ORDER — SODIUM CHLORIDE (PF) 0.9 % IJ SOLN
INTRAMUSCULAR | Status: AC
  Filled 2019-07-26: qty 10

## 2019-07-26 MED ORDER — METHOCARBAMOL 500 MG PO TABS
500.0000 mg | ORAL_TABLET | Freq: Four times a day (QID) | ORAL | Status: DC | PRN
  Administered 2019-07-26 – 2019-07-27 (×3): 500 mg via ORAL
  Filled 2019-07-26 (×3): qty 1

## 2019-07-26 MED ORDER — TOPIRAMATE 25 MG PO TABS
25.0000 mg | ORAL_TABLET | Freq: Every day | ORAL | Status: DC
  Administered 2019-07-26: 25 mg via ORAL
  Filled 2019-07-26: qty 1

## 2019-07-26 MED ORDER — FENTANYL CITRATE (PF) 100 MCG/2ML IJ SOLN
50.0000 ug | Freq: Once | INTRAMUSCULAR | Status: AC
  Administered 2019-07-26: 50 ug via INTRAVENOUS
  Filled 2019-07-26: qty 2

## 2019-07-26 MED ORDER — ACETAMINOPHEN 10 MG/ML IV SOLN
1000.0000 mg | Freq: Four times a day (QID) | INTRAVENOUS | Status: DC
  Administered 2019-07-26: 1000 mg via INTRAVENOUS
  Filled 2019-07-26: qty 100

## 2019-07-26 MED ORDER — EPHEDRINE 5 MG/ML INJ
INTRAVENOUS | Status: AC
  Filled 2019-07-26: qty 10

## 2019-07-26 MED ORDER — OXYCODONE HCL 5 MG PO TABS
5.0000 mg | ORAL_TABLET | ORAL | Status: DC | PRN
  Administered 2019-07-26: 5 mg via ORAL
  Administered 2019-07-26 – 2019-07-27 (×3): 10 mg via ORAL
  Filled 2019-07-26 (×2): qty 2
  Filled 2019-07-26: qty 1
  Filled 2019-07-26: qty 2

## 2019-07-26 MED ORDER — METOCLOPRAMIDE HCL 5 MG/ML IJ SOLN: 5.0000 mg | Freq: Three times a day (TID) | INTRAMUSCULAR | Status: DC | PRN

## 2019-07-26 MED ORDER — ONDANSETRON HCL 4 MG/2ML IJ SOLN
INTRAMUSCULAR | Status: AC
  Filled 2019-07-26: qty 2

## 2019-07-26 MED ORDER — FENTANYL CITRATE (PF) 100 MCG/2ML IJ SOLN: 25.0000 ug | INTRAMUSCULAR | Status: DC | PRN

## 2019-07-26 MED ORDER — STERILE WATER FOR IRRIGATION IR SOLN
Status: DC | PRN
  Administered 2019-07-26: 1000 mL

## 2019-07-26 MED ORDER — AMPHETAMINE-DEXTROAMPHET ER 25 MG PO CP24: 25.0000 mg | ORAL_CAPSULE | Freq: Every day | ORAL | Status: DC

## 2019-07-26 MED ORDER — METOCLOPRAMIDE HCL 5 MG PO TABS: 5.0000 mg | ORAL_TABLET | Freq: Three times a day (TID) | ORAL | Status: DC | PRN

## 2019-07-26 MED ORDER — SODIUM CHLORIDE 0.9 % IR SOLN
Status: DC | PRN
  Administered 2019-07-26: 1000 mL

## 2019-07-26 MED ORDER — PROPOFOL 500 MG/50ML IV EMUL
INTRAVENOUS | Status: AC
  Filled 2019-07-26: qty 50

## 2019-07-26 MED ORDER — LACTATED RINGERS IV SOLN: INTRAVENOUS | Status: DC

## 2019-07-26 MED ORDER — MENTHOL 3 MG MT LOZG: 1.0000 | LOZENGE | OROMUCOSAL | Status: DC | PRN

## 2019-07-26 MED ORDER — 0.9 % SODIUM CHLORIDE (POUR BTL) OPTIME
TOPICAL | Status: DC | PRN
  Administered 2019-07-26: 14:00:00 1000 mL

## 2019-07-26 MED ORDER — FLEET ENEMA 7-19 GM/118ML RE ENEM: 1.0000 | ENEMA | Freq: Once | RECTAL | Status: DC | PRN

## 2019-07-26 MED ORDER — BUPIVACAINE LIPOSOME 1.3 % IJ SUSP
INTRAMUSCULAR | Status: DC | PRN
  Administered 2019-07-26: 20 mL

## 2019-07-26 MED ORDER — PHENYLEPHRINE HCL-NACL 10-0.9 MG/250ML-% IV SOLN
INTRAVENOUS | Status: DC | PRN
  Administered 2019-07-26: 40 ug/min via INTRAVENOUS

## 2019-07-26 MED ORDER — TRANEXAMIC ACID-NACL 1000-0.7 MG/100ML-% IV SOLN
1000.0000 mg | Freq: Once | INTRAVENOUS | Status: AC
  Administered 2019-07-26: 1000 mg via INTRAVENOUS
  Filled 2019-07-26: qty 100

## 2019-07-26 MED ORDER — ACETAMINOPHEN 325 MG PO TABS
325.0000 mg | ORAL_TABLET | Freq: Four times a day (QID) | ORAL | Status: DC | PRN
  Administered 2019-07-27: 650 mg via ORAL
  Filled 2019-07-26: qty 2

## 2019-07-26 MED ORDER — DEXAMETHASONE SODIUM PHOSPHATE 10 MG/ML IJ SOLN
8.0000 mg | Freq: Once | INTRAMUSCULAR | Status: AC
  Administered 2019-07-26: 8 mg via INTRAVENOUS

## 2019-07-26 MED ORDER — SODIUM CHLORIDE 0.9 % IV SOLN: INTRAVENOUS | Status: DC

## 2019-07-26 MED ORDER — PHENYLEPHRINE 40 MCG/ML (10ML) SYRINGE FOR IV PUSH (FOR BLOOD PRESSURE SUPPORT)
PREFILLED_SYRINGE | INTRAVENOUS | Status: DC | PRN
  Administered 2019-07-26 (×2): 80 ug via INTRAVENOUS

## 2019-07-26 MED ORDER — TRANEXAMIC ACID-NACL 1000-0.7 MG/100ML-% IV SOLN
1000.0000 mg | INTRAVENOUS | Status: AC
  Administered 2019-07-26: 1000 mg via INTRAVENOUS
  Filled 2019-07-26: qty 100

## 2019-07-26 MED ORDER — POVIDONE-IODINE 10 % EX SWAB
2.0000 "application " | Freq: Once | CUTANEOUS | Status: AC
  Administered 2019-07-26: 2 via TOPICAL

## 2019-07-26 MED ORDER — GABAPENTIN 300 MG PO CAPS
300.0000 mg | ORAL_CAPSULE | Freq: Three times a day (TID) | ORAL | Status: DC
  Administered 2019-07-26 – 2019-07-27 (×3): 300 mg via ORAL
  Filled 2019-07-26 (×3): qty 1

## 2019-07-26 MED ORDER — PROPOFOL 10 MG/ML IV BOLUS
INTRAVENOUS | Status: DC | PRN
  Administered 2019-07-26: 20 mg via INTRAVENOUS
  Administered 2019-07-26 (×3): 30 mg via INTRAVENOUS

## 2019-07-26 MED ORDER — DOCUSATE SODIUM 100 MG PO CAPS
100.0000 mg | ORAL_CAPSULE | Freq: Two times a day (BID) | ORAL | Status: DC
  Administered 2019-07-26 – 2019-07-27 (×2): 100 mg via ORAL
  Filled 2019-07-26 (×2): qty 1

## 2019-07-26 MED ORDER — POLYETHYLENE GLYCOL 3350 17 G PO PACK: 17.0000 g | PACK | Freq: Every day | ORAL | Status: DC | PRN

## 2019-07-26 MED ORDER — MIDAZOLAM HCL 2 MG/2ML IJ SOLN
1.0000 mg | Freq: Once | INTRAMUSCULAR | Status: AC
  Filled 2019-07-26: qty 2

## 2019-07-26 MED ORDER — PROPOFOL 10 MG/ML IV BOLUS
INTRAVENOUS | Status: AC
  Filled 2019-07-26: qty 40

## 2019-07-26 MED ORDER — PHENOL 1.4 % MT LIQD: 1.0000 | OROMUCOSAL | Status: DC | PRN

## 2019-07-26 MED ORDER — ACETAMINOPHEN 10 MG/ML IV SOLN: 1000.0000 mg | Freq: Once | INTRAVENOUS | Status: DC | PRN

## 2019-07-26 MED ORDER — DULOXETINE HCL 60 MG PO CPEP
60.0000 mg | ORAL_CAPSULE | Freq: Every day | ORAL | Status: DC
  Administered 2019-07-27: 60 mg via ORAL
  Filled 2019-07-26: qty 1

## 2019-07-26 MED ORDER — HYDROMORPHONE HCL 1 MG/ML IJ SOLN
0.5000 mg | INTRAMUSCULAR | Status: DC | PRN
  Administered 2019-07-26 – 2019-07-27 (×2): 1 mg via INTRAVENOUS
  Filled 2019-07-26 (×2): qty 1

## 2019-07-26 MED ORDER — PROPOFOL 500 MG/50ML IV EMUL
INTRAVENOUS | Status: DC | PRN
  Administered 2019-07-26: 50 ug/kg/min via INTRAVENOUS

## 2019-07-26 MED ORDER — METHOCARBAMOL 500 MG IVPB - SIMPLE MED
500.0000 mg | Freq: Four times a day (QID) | INTRAVENOUS | Status: DC | PRN
  Filled 2019-07-26: qty 50

## 2019-07-26 MED ORDER — SODIUM CHLORIDE (PF) 0.9 % IJ SOLN
INTRAMUSCULAR | Status: DC | PRN
  Administered 2019-07-26: 60 mL via INTRAVENOUS

## 2019-07-26 MED ORDER — OXYCODONE HCL 5 MG PO TABS: 5.0000 mg | ORAL_TABLET | Freq: Once | ORAL | Status: DC | PRN

## 2019-07-26 MED ORDER — DEXAMETHASONE SODIUM PHOSPHATE 10 MG/ML IJ SOLN
INTRAMUSCULAR | Status: AC
  Filled 2019-07-26: qty 1

## 2019-07-26 MED ORDER — RIVAROXABAN 10 MG PO TABS
10.0000 mg | ORAL_TABLET | Freq: Every day | ORAL | Status: DC
  Administered 2019-07-27: 10 mg via ORAL
  Filled 2019-07-26: qty 1

## 2019-07-26 MED ORDER — DIPHENHYDRAMINE HCL 12.5 MG/5ML PO ELIX: 12.5000 mg | ORAL_SOLUTION | ORAL | Status: DC | PRN

## 2019-07-26 MED ORDER — OXYCODONE HCL 5 MG/5ML PO SOLN: 5.0000 mg | Freq: Once | ORAL | Status: DC | PRN

## 2019-07-26 MED ORDER — ACETAMINOPHEN 160 MG/5ML PO SOLN: 1000.0000 mg | Freq: Once | ORAL | Status: DC | PRN

## 2019-07-26 MED ORDER — BUPIVACAINE IN DEXTROSE 0.75-8.25 % IT SOLN
INTRATHECAL | Status: DC | PRN
  Administered 2019-07-26: 1.6 mL via INTRATHECAL

## 2019-07-26 MED ORDER — ONDANSETRON HCL 4 MG/2ML IJ SOLN: 4.0000 mg | Freq: Four times a day (QID) | INTRAMUSCULAR | Status: DC | PRN

## 2019-07-26 MED ORDER — PROPOFOL 10 MG/ML IV BOLUS
INTRAVENOUS | Status: AC
  Filled 2019-07-26: qty 20

## 2019-07-26 MED ORDER — ONDANSETRON HCL 4 MG PO TABS
4.0000 mg | ORAL_TABLET | Freq: Four times a day (QID) | ORAL | Status: DC | PRN
  Filled 2019-07-26: qty 1

## 2019-07-26 MED ORDER — SODIUM CHLORIDE (PF) 0.9 % IJ SOLN
INTRAMUSCULAR | Status: AC
  Filled 2019-07-26: qty 50

## 2019-07-26 MED ORDER — VANCOMYCIN HCL IN DEXTROSE 1-5 GM/200ML-% IV SOLN
1000.0000 mg | Freq: Two times a day (BID) | INTRAVENOUS | Status: AC
  Administered 2019-07-26: 1000 mg via INTRAVENOUS
  Filled 2019-07-26: qty 200

## 2019-07-26 MED ORDER — BISACODYL 10 MG RE SUPP: 10.0000 mg | Freq: Every day | RECTAL | Status: DC | PRN

## 2019-07-26 MED ORDER — BUPRENORPHINE HCL 2 MG SL SUBL: 2.0000 mg | SUBLINGUAL_TABLET | Freq: Every day | SUBLINGUAL | Status: DC

## 2019-07-26 MED ORDER — ROPIVACAINE HCL 7.5 MG/ML IJ SOLN
INTRAMUSCULAR | Status: DC | PRN
  Administered 2019-07-26: 20 mL via PERINEURAL

## 2019-07-26 MED ORDER — CHLORHEXIDINE GLUCONATE 4 % EX LIQD: 60.0000 mL | Freq: Once | CUTANEOUS | Status: DC

## 2019-07-26 MED ORDER — ACETAMINOPHEN 500 MG PO TABS: 1000.0000 mg | ORAL_TABLET | Freq: Once | ORAL | Status: DC | PRN

## 2019-07-26 MED ORDER — VANCOMYCIN HCL IN DEXTROSE 1-5 GM/200ML-% IV SOLN
1000.0000 mg | INTRAVENOUS | Status: AC
  Administered 2019-07-26: 1000 mg via INTRAVENOUS
  Filled 2019-07-26: qty 200

## 2019-07-26 MED ORDER — ONDANSETRON HCL 4 MG/2ML IJ SOLN
INTRAMUSCULAR | Status: DC | PRN
  Administered 2019-07-26: 4 mg via INTRAVENOUS

## 2019-07-26 SURGICAL SUPPLY — 63 items
ATTUNE MED DOME PAT 38 KNEE (Knees) ×2 IMPLANT
ATTUNE MED DOME PAT 38MM KNEE (Knees) ×1 IMPLANT
ATTUNE PSFEM LTSZ6 NARCEM KNEE (Femur) ×3 IMPLANT
ATTUNE PSRP INSR SZ6 7 KNEE (Insert) ×2 IMPLANT
ATTUNE PSRP INSR SZ6 7MM KNEE (Insert) ×1 IMPLANT
BAG ZIPLOCK 12X15 (MISCELLANEOUS) ×3 IMPLANT
BASE TIBIA ATTUNE KNEE SYS SZ6 (Knees) ×1 IMPLANT
BLADE SAG 18X100X1.27 (BLADE) ×3 IMPLANT
BLADE SAW SGTL 11.0X1.19X90.0M (BLADE) ×3 IMPLANT
BLADE SURG SZ10 CARB STEEL (BLADE) ×6 IMPLANT
BNDG ELASTIC 6X5.8 VLCR STR LF (GAUZE/BANDAGES/DRESSINGS) ×3 IMPLANT
BOWL SMART MIX CTS (DISPOSABLE) ×3 IMPLANT
CEMENT HV SMART SET (Cement) ×6 IMPLANT
CLOSURE WOUND 1/2 X4 (GAUZE/BANDAGES/DRESSINGS) ×2
COVER SURGICAL LIGHT HANDLE (MISCELLANEOUS) ×3 IMPLANT
COVER WAND RF STERILE (DRAPES) IMPLANT
CUFF TOURN SGL QUICK 34 (TOURNIQUET CUFF) ×2
CUFF TRNQT CYL 34X4.125X (TOURNIQUET CUFF) ×1 IMPLANT
DECANTER SPIKE VIAL GLASS SM (MISCELLANEOUS) ×3 IMPLANT
DRAPE U-SHAPE 47X51 STRL (DRAPES) ×3 IMPLANT
DRSG ADAPTIC 3X8 NADH LF (GAUZE/BANDAGES/DRESSINGS) ×3 IMPLANT
DRSG AQUACEL AG ADV 3.5X 6 (GAUZE/BANDAGES/DRESSINGS) IMPLANT
DRSG PAD ABDOMINAL 8X10 ST (GAUZE/BANDAGES/DRESSINGS) ×3 IMPLANT
DURAPREP 26ML APPLICATOR (WOUND CARE) ×3 IMPLANT
ELECT REM PT RETURN 15FT ADLT (MISCELLANEOUS) ×3 IMPLANT
EVACUATOR 1/8 PVC DRAIN (DRAIN) ×3 IMPLANT
GAUZE SPONGE 2X2 8PLY STRL LF (GAUZE/BANDAGES/DRESSINGS) IMPLANT
GAUZE SPONGE 4X4 12PLY STRL (GAUZE/BANDAGES/DRESSINGS) ×3 IMPLANT
GLOVE BIO SURGEON STRL SZ7 (GLOVE) ×3 IMPLANT
GLOVE BIO SURGEON STRL SZ8 (GLOVE) ×3 IMPLANT
GLOVE BIOGEL PI IND STRL 6.5 (GLOVE) ×1 IMPLANT
GLOVE BIOGEL PI IND STRL 7.0 (GLOVE) ×1 IMPLANT
GLOVE BIOGEL PI IND STRL 8 (GLOVE) ×1 IMPLANT
GLOVE BIOGEL PI INDICATOR 6.5 (GLOVE) ×2
GLOVE BIOGEL PI INDICATOR 7.0 (GLOVE) ×2
GLOVE BIOGEL PI INDICATOR 8 (GLOVE) ×2
GLOVE SURG SS PI 6.5 STRL IVOR (GLOVE) ×3 IMPLANT
GOWN STRL REUS W/TWL LRG LVL3 (GOWN DISPOSABLE) ×9 IMPLANT
HANDPIECE INTERPULSE COAX TIP (DISPOSABLE) ×2
HOLDER FOLEY CATH W/STRAP (MISCELLANEOUS) IMPLANT
IMMOBILIZER KNEE 20 (SOFTGOODS) ×3
IMMOBILIZER KNEE 20 THIGH 36 (SOFTGOODS) ×1 IMPLANT
KIT TURNOVER KIT A (KITS) IMPLANT
MANIFOLD NEPTUNE II (INSTRUMENTS) ×3 IMPLANT
NS IRRIG 1000ML POUR BTL (IV SOLUTION) ×3 IMPLANT
PACK TOTAL KNEE CUSTOM (KITS) ×3 IMPLANT
PADDING CAST COTTON 6X4 STRL (CAST SUPPLIES) ×3 IMPLANT
PENCIL SMOKE EVACUATOR (MISCELLANEOUS) IMPLANT
PIN STEINMAN FIXATION KNEE (PIN) ×3 IMPLANT
PROTECTOR NERVE ULNAR (MISCELLANEOUS) ×3 IMPLANT
SET HNDPC FAN SPRY TIP SCT (DISPOSABLE) ×1 IMPLANT
SPONGE GAUZE 2X2 STER 10/PKG (GAUZE/BANDAGES/DRESSINGS)
STRIP CLOSURE SKIN 1/2X4 (GAUZE/BANDAGES/DRESSINGS) ×4 IMPLANT
SUT MNCRL AB 4-0 PS2 18 (SUTURE) ×3 IMPLANT
SUT STRATAFIX 0 PDS 27 VIOLET (SUTURE) ×3
SUT VIC AB 2-0 CT1 27 (SUTURE) ×6
SUT VIC AB 2-0 CT1 TAPERPNT 27 (SUTURE) ×3 IMPLANT
SUTURE STRATFX 0 PDS 27 VIOLET (SUTURE) ×1 IMPLANT
TIBIA ATTUNE KNEE SYS BASE SZ6 (Knees) ×3 IMPLANT
TRAY FOLEY MTR SLVR 16FR STAT (SET/KITS/TRAYS/PACK) ×3 IMPLANT
WATER STERILE IRR 1000ML POUR (IV SOLUTION) ×6 IMPLANT
WRAP KNEE MAXI GEL POST OP (GAUZE/BANDAGES/DRESSINGS) ×3 IMPLANT
YANKAUER SUCT BULB TIP 10FT TU (MISCELLANEOUS) ×3 IMPLANT

## 2019-07-26 NOTE — Plan of Care (Signed)
  Problem: Nutrition: Goal: Adequate nutrition will be maintained Outcome: Progressing   Problem: Activity: Goal: Risk for activity intolerance will decrease Outcome: Progressing   Problem: Pain Managment: Goal: General experience of comfort will improve Outcome: Progressing   Problem: Safety: Goal: Ability to remain free from injury will improve Outcome: Progressing   Problem: Education: Goal: Knowledge of the prescribed therapeutic regimen will improve Outcome: Progressing   Problem: Pain Management: Goal: Pain level will decrease with appropriate interventions Outcome: Progressing

## 2019-07-26 NOTE — Anesthesia Postprocedure Evaluation (Signed)
Anesthesia Post Note  Patient: Kristina Rowland  Procedure(s) Performed: TOTAL KNEE ARTHROPLASTY (Left Knee)     Anesthesia Post Evaluation  Last Vitals:  Vitals:   07/26/19 1646 07/26/19 1804  BP: 112/60 129/77  Pulse: 66 78  Resp: 16 16  Temp: 36.7 C 36.6 C  SpO2: 98% 97%    Last Pain:  Vitals:   07/26/19 1804  TempSrc: Oral  PainSc:                  Elania Crowl

## 2019-07-26 NOTE — Op Note (Signed)
OPERATIVE REPORT-TOTAL KNEE ARTHROPLASTY   Pre-operative diagnosis- Osteoarthritis  Left knee(s)  Post-operative diagnosis- Osteoarthritis Left knee(s)  Procedure-  Left  Total Knee Arthroplasty  Surgeon- Kristina Plover. Ainslee Sou, MD  Assistant- Ardeen Jourdain, PA-C   Anesthesia-  Adductor canal block and spinal  EBL-50 mL   Drains Hemovac  Tourniquet time-  Total Tourniquet Time Documented: Thigh (Left) - 40 minutes Total: Thigh (Left) - 40 minutes     Complications- None  Condition-PACU - hemodynamically stable.   Brief Clinical Note  Kristina Rowland is a 69 y.o. year old female with end stage OA of her left knee with progressively worsening pain and dysfunction. She has constant pain, with activity and at rest and significant functional deficits with difficulties even with ADLs. She has had extensive non-op management including analgesics, injections of cortisone and viscosupplements, and home exercise program, but remains in significant pain with significant dysfunction. Radiographs show bone on bone arthritis medial and patellofemoral. She presents now for left Total Knee Arthroplasty.    Procedure in detail---   The patient is brought into the operating room and positioned supine on the operating table. After successful administration of  Adductor canal block and spinal,   a tourniquet is placed high on the  Left thigh(s) and the lower extremity is prepped and draped in the usual sterile fashion. Time out is performed by the operating team and then the  Left lower extremity is wrapped in Esmarch, knee flexed and the tourniquet inflated to 300 mmHg.       A midline incision is made with a ten blade through the subcutaneous tissue to the level of the extensor mechanism. A fresh blade is used to make a medial parapatellar arthrotomy. Soft tissue over the proximal medial tibia is subperiosteally elevated to the joint line with a knife and into the semimembranosus bursa with a Cobb  elevator. Soft tissue over the proximal lateral tibia is elevated with attention being paid to avoiding the patellar tendon on the tibial tubercle. The patella is everted, knee flexed 90 degrees and the ACL and PCL are removed. Findings are bone on bone medial and patellofemoral with large global osteophytes        The drill is used to create a starting hole in the distal femur and the canal is thoroughly irrigated with sterile saline to remove the fatty contents. The 5 degree Left  valgus alignment guide is placed into the femoral canal and the distal femoral cutting block is pinned to remove 9 mm off the distal femur. Resection is made with an oscillating saw.      The tibia is subluxed forward and the menisci are removed. The extramedullary alignment guide is placed referencing proximally at the medial aspect of the tibial tubercle and distally along the second metatarsal axis and tibial crest. The block is pinned to remove 83mm off the more deficient medial  side. Resection is made with an oscillating saw. Size 6is the most appropriate size for the tibia and the proximal tibia is prepared with the modular drill and keel punch for that size.      The femoral sizing guide is placed and size 6 is most appropriate. Rotation is marked off the epicondylar axis and confirmed by creating a rectangular flexion gap at 90 degrees. The size 6 cutting block is pinned in this rotation and the anterior, posterior and chamfer cuts are made with the oscillating saw. The intercondylar block is then placed and that cut is made.  Trial size 6 tibial component, trial size 6 narrow posterior stabilized femur and a 7  mm posterior stabilized rotating platform insert trial is placed. Full extension is achieved with excellent varus/valgus and anterior/posterior balance throughout full range of motion. The patella is everted and thickness measured to be 22  mm. Free hand resection is taken to 12 mm, a 38 template is placed, lug  holes are drilled, trial patella is placed, and it tracks normally. Osteophytes are removed off the posterior femur with the trial in place. All trials are removed and the cut bone surfaces prepared with pulsatile lavage. Cement is mixed and once ready for implantation, the size 6 tibial implant, size  6 narrow posterior stabilized femoral component, and the size 38 patella are cemented in place and the patella is held with the clamp. The trial insert is placed and the knee held in full extension. The Exparel (20 ml mixed with 60 ml saline) is injected into the extensor mechanism, posterior capsule, medial and lateral gutters and subcutaneous tissues.  All extruded cement is removed and once the cement is hard the permanent 7 mm posterior stabilized rotating platform insert is placed into the tibial tray.      The wound is copiously irrigated with saline solution and the extensor mechanism closed over a hemovac drain with #1 V-loc suture. The tourniquet is released for a total tourniquet time of 39  minutes. Flexion against gravity is 140 degrees and the patella tracks normally. Subcutaneous tissue is closed with 2.0 vicryl and subcuticular with running 4.0 Monocryl. The incision is cleaned and dried and steri-strips and a bulky sterile dressing are applied. The limb is placed into a knee immobilizer and the patient is awakened and transported to recovery in stable condition.      Please note that a surgical assistant was a medical necessity for this procedure in order to perform it in a safe and expeditious manner. Surgical assistant was necessary to retract the ligaments and vital neurovascular structures to prevent injury to them and also necessary for proper positioning of the limb to allow for anatomic placement of the prosthesis.   Kristina Plover Calyb Mcquarrie, MD    07/26/2019, 2:55 PM   m

## 2019-07-26 NOTE — Anesthesia Preprocedure Evaluation (Signed)
Anesthesia Evaluation  Patient identified by MRN, date of birth, ID band Patient awake    Reviewed: Allergy & Precautions, NPO status , Patient's Chart, lab work & pertinent test results  History of Anesthesia Complications Negative for: history of anesthetic complications  Airway Mallampati: II  TM Distance: >3 FB Neck ROM: Full    Dental  (+) Dental Advisory Given, Teeth Intact   Pulmonary neg shortness of breath, neg sleep apnea, neg COPD, neg recent URI, former smoker,    breath sounds clear to auscultation       Cardiovascular hypertension, Pt. on medications (-) angina(-) Past MI and (-) CHF (-) dysrhythmias  Rhythm:Regular     Neuro/Psych  Headaches, PSYCHIATRIC DISORDERS Depression    GI/Hepatic negative GI ROS, Neg liver ROS,   Endo/Other  negative endocrine ROS  Renal/GU negative Renal ROS     Musculoskeletal  (+) Arthritis ,   Abdominal   Peds  Hematology negative hematology ROS (+)   Anesthesia Other Findings   Reproductive/Obstetrics                             Anesthesia Physical Anesthesia Plan  ASA: II  Anesthesia Plan: MAC, Regional and Spinal   Post-op Pain Management:    Induction: Intravenous  PONV Risk Score and Plan: 2 and Treatment may vary due to age or medical condition and Propofol infusion  Airway Management Planned: Nasal Cannula  Additional Equipment: None  Intra-op Plan:   Post-operative Plan:   Informed Consent: I have reviewed the patients History and Physical, chart, labs and discussed the procedure including the risks, benefits and alternatives for the proposed anesthesia with the patient or authorized representative who has indicated his/her understanding and acceptance.     Dental advisory given  Plan Discussed with: CRNA and Surgeon  Anesthesia Plan Comments:         Anesthesia Quick Evaluation

## 2019-07-26 NOTE — Anesthesia Procedure Notes (Signed)
Anesthesia Regional Block: Adductor canal block   Pre-Anesthetic Checklist: ,, timeout performed, Correct Patient, Correct Site, Correct Laterality, Correct Procedure, Correct Position, site marked, Risks and benefits discussed,  Surgical consent,  Pre-op evaluation,  At surgeon's request and post-op pain management  Laterality: Left and Lower  Prep: chloraprep       Needles:  Injection technique: Single-shot     Needle Length: 9cm  Needle Gauge: 22     Additional Needles: Arrow StimuQuik ECHO Echogenic Stimulating PNB Needle  Procedures:,,,, ultrasound used (permanent image in chart),,,,  Narrative:  Start time: 07/26/2019 1:18 PM End time: 07/26/2019 1:23 PM Injection made incrementally with aspirations every 5 mL.  Performed by: Personally  Anesthesiologist: Oleta Mouse, MD

## 2019-07-26 NOTE — Evaluation (Signed)
Physical Therapy Evaluation Patient Details Name: Kristina Rowland MRN: [id number] DOB: [birth year] Today's Date: 07/26/2019   History of Present Illness  Patient is 69 y.o. female s/p Lt TKA on 07/24/20 with PMH significant for HTN, depression, hip pain, leukemia, OA, L4-5 decompression/fusion.    Clinical Impression  CANADA ORCUTT is a 69 y.o. female POD 0 s/p Lt TKA. Patient reports independence with mobility at baseline with occasional use of walking stick. Patient is now limited by functional impairments (see PT problem list below) and requires min guard/assist for transfers and gait with RW. Patient was able to ambulate ~60 feet with RW and min assist. Patient instructed in exercise to facilitate ROM and circulation. Patient will benefit from continued skilled PT interventions to address impairments and progress towards PLOF. Acute PT will follow to progress mobility and stair training in preparation for safe discharge home.     Follow Up Recommendations Follow surgeon's recommendation for DC plan and follow-up therapies    Equipment Recommendations  Rolling walker with 5" wheels    Recommendations for Other Services       Precautions / Restrictions Precautions Precautions: Fall Restrictions Weight Bearing Restrictions: No      Mobility  Bed Mobility Overal bed mobility: Needs Assistance Bed Mobility: Supine to Sit     Supine to sit: HOB elevated;Supervision     General bed mobility comments: no assist required, pt requried extra time  Transfers Overall transfer level: Needs assistance Equipment used: Rolling walker (2 wheeled) Transfers: Sit to/from Stand Sit to Stand: Min guard         General transfer comment: pt able to initiate power up without assistance, guarding for safety  Ambulation/Gait Ambulation/Gait assistance: Min assist Gait Distance (Feet): 60 Feet Assistive device: Rolling walker (2 wheeled) Gait Pattern/deviations: Step-to pattern;Decreased  stance time - left;Decreased weight shift to left;Decreased step length - right;Decreased stride length Gait velocity: decreased   General Gait Details: cues for safe step pattern and sequence with RW, cues for safe proximity, no overt LOB noted and pt was able to unweight her Lt LE with support on RW to prevent buckling.  Stairs            Wheelchair Mobility    Modified Rankin (Stroke Patients Only)       Balance Overall balance assessment: Needs assistance Sitting-balance support: Feet supported Sitting balance-Leahy Scale: Good     Standing balance support: During functional activity;Bilateral upper extremity supported Standing balance-Leahy Scale: Poor            Pertinent Vitals/Pain Pain Assessment: 0-10 Pain Score: 4  Pain Location: Lt knee Pain Descriptors / Indicators: Aching;Sore;Burning Pain Intervention(s): Limited activity within patient's tolerance;Monitored during session;Repositioned    Home Living Family/patient expects to be discharged to:: Private residence Living Arrangements: Spouse/significant other Available Help at Discharge: Family Type of Home: House Home Access: Stairs to enter   [employer] of Steps: 1 step no rail at front, 2 with no rail at garage (pt somteimtes uses washing machine for railing) Home Layout: Two level;Bed/bath upstairs;1/2 bath on main level Home Equipment: Cane - single point Additional Comments: uses walking "staff"    Prior Function Level of Independence: Independent         Comments: uses a walking stick outside     Hand Dominance   Dominant Hand: Right    Extremity/Trunk Assessment   Upper Extremity Assessment Upper Extremity Assessment: Overall WFL for tasks assessed    Lower Extremity Assessment Lower Extremity  Assessment: LLE deficits/detail LLE Deficits / Details: pt with good quad activation, no extensor lag LLE Sensation: WNL LLE Coordination: WNL    Cervical / Trunk  Assessment Cervical / Trunk Assessment: Normal  Communication   Communication: No difficulties  Cognition Arousal/Alertness: Awake/alert Behavior During Therapy: WFL for tasks assessed/performed Overall Cognitive Status: Within Functional Limits for tasks assessed           General Comments      Exercises Total Joint Exercises Ankle Circles/Pumps: AROM;Seated;Both;10 reps Quad Sets: AROM;Seated;Left;5 reps Heel Slides: AAROM;Seated;Left;5 reps   Assessment/Plan    PT Assessment Patient needs continued PT services  PT Problem List Decreased strength;Decreased mobility;Decreased range of motion;Decreased activity tolerance;Decreased balance;Decreased knowledge of use of DME       PT Treatment Interventions DME instruction;Therapeutic exercise;Balance training;Gait training;Stair training;Functional mobility training;Therapeutic activities;Patient/family education    PT Goals (Current goals can be found in the Care Plan section)  Acute Rehab PT Goals Patient Stated Goal: to get back to walking more PT Goal Formulation: With patient Time For Goal Achievement: 08/02/19 Potential to Achieve Goals: Good    Frequency 7X/week    AM-PAC PT "6 Clicks" Mobility  Outcome Measure Help needed turning from your back to your side while in a flat bed without using bedrails?: None Help needed moving from lying on your back to sitting on the side of a flat bed without using bedrails?: A Little Help needed moving to and from a bed to a chair (including a wheelchair)?: A Little Help needed standing up from a chair using your arms (e.g., wheelchair or bedside chair)?: A Little Help needed to walk in hospital room?: A Little Help needed climbing 3-5 steps with a railing? : A Little 6 Click Score: 19    End of Session Equipment Utilized During Treatment: Gait belt Activity Tolerance: Patient tolerated treatment well Patient left: with chair alarm set;in chair;with call bell/phone within  reach Nurse Communication: Mobility status PT Visit Diagnosis: Muscle weakness (generalized) (M62.81);Difficulty in walking, not elsewhere classified (R26.2)    Time: [id number] PT Time Calculation (min) (ACUTE ONLY): 35 min   Charges:   PT Evaluation $PT Eval Low Complexity: 1 Low PT Treatments $Therapeutic Exercise: 8-22 mins       Verner Mould, DPT Physical Therapist with [facility] [phone]  07/26/2019 6:59 PM

## 2019-07-26 NOTE — Interval H&P Note (Signed)
History and Physical Interval Note:  07/26/2019 10:26 AM  Kristina Rowland  has presented today for surgery, with the diagnosis of left knee osteoarthritis.  The various methods of treatment have been discussed with the patient and family. After consideration of risks, benefits and other options for treatment, the patient has consented to  Procedure(s) with comments: TOTAL KNEE ARTHROPLASTY (Left) - 73min as a surgical intervention.  The patient's history has been reviewed, patient examined, no change in status, stable for surgery.  I have reviewed the patient's chart and labs.  Questions were answered to the patient's satisfaction.     Pilar Plate Clariza Sickman

## 2019-07-26 NOTE — Care Management CC44 (Signed)
Condition Code 44 Documentation Completed  Patient Details  Name: Kristina Rowland MRN: [id number] Date of Birth: May 04, 1951   Condition Code 44 given:  Yes Patient signature on Condition Code 44 notice:  Yes Documentation of 2 MD's agreement:  Yes Code 44 added to claim:  Yes    Erenest Rasher, RN 07/26/2019, 6:51 PM

## 2019-07-26 NOTE — Anesthesia Procedure Notes (Addendum)
Procedure Name: MAC Date/Time: 07/26/2019 1:35 PM Performed by: West Pugh, CRNA Pre-anesthesia Checklist: Patient identified, Emergency Drugs available, Suction available, Patient being monitored and Timeout performed Patient Re-evaluated:Patient Re-evaluated prior to induction Oxygen Delivery Method: Simple face mask Preoxygenation: Pre-oxygenation with 100% oxygen Placement Confirmation: positive ETCO2 Dental Injury: Teeth and Oropharynx as per pre-operative assessment

## 2019-07-26 NOTE — Discharge Instructions (Addendum)
Kristina Arabian, MD Total Joint Specialist [facility] [address] [place], [place] [address] [phone]  TOTAL KNEE REPLACEMENT POSTOPERATIVE DIRECTIONS    Knee Rehabilitation, Guidelines Following Surgery  Results after knee surgery are often greatly improved when you follow the exercise, range of motion and muscle strengthening exercises prescribed by your doctor. Safety measures are also important to protect the knee from further injury. If any of these exercises cause you to have increased pain or swelling in your knee joint, decrease the amount until you are comfortable again and slowly increase them. If you have problems or questions, call your caregiver or physical therapist for advice.   BLOOD CLOT PREVENTION . Take a 10 mg Xarelto once a day for three weeks following surgery. Then take an 81 mg Aspirin once a day for three weeks. Then discontinue Aspirin. . You may resume your vitamins/supplements upon once you have discontinued the Xarelto. . Do not take any NSAIDs (Advil, Aleve, Ibuprofen, Meloxicam, etc.) until you have discontinued the Xarelto.   HOME CARE INSTRUCTIONS  . Remove items at home which could result in a fall. This includes throw rugs or furniture in walking pathways.  . ICE to the affected knee as much as tolerated. Icing helps control swelling. If the swelling is well controlled you will be more comfortable and rehab easier. Continue to use ice on the knee for pain and swelling from surgery. You may notice swelling that will progress down to the foot and ankle. This is normal after surgery. Elevate the leg when you are not up walking on it.    . Continue to use the breathing machine which will help keep your temperature down. It is common for your temperature to cycle up and down following surgery, especially at night when you are not up moving around and exerting yourself. The breathing machine keeps your lungs expanded and your  temperature down. . Do not place pillow under the operative knee, focus on keeping the knee straight while resting  DIET You may resume your previous home diet once you are discharged from the hospital.  DRESSING / [place] / SHOWERING . Remove the bulky dressing 2 days following surgery, this will include an ACE wrap and rolled gauze. Leave the tape strips across the incision in place until your first follow-up appointment. Cover the incision with 4x4 gauze and paper tape. Change the dressing daily with new gauze and paper tape for 7-10 days following surgery. . You may begin showering 3 days following surgery, but do not submerge the incision under water. . Allow water and soap to run over the incision, pat dry, and apply a new gauze dressing  ACTIVITY For the first 5 days, the key is rest and control of pain and swelling . Do your home exercises twice a day starting on post-operative day 3. On the days you go to physical therapy, just do the home exercises once that day. . You should rest, ice and elevate the leg for 50 minutes out of every hour. Get up and walk/stretch for 10 minutes per hour. After 5 days you can increase your activity slowly as tolerated. . Walk with your walker as instructed. Use the walker until you are comfortable transitioning to a cane. Walk with the cane in the opposite hand of the operative leg. You may discontinue the cane once you are comfortable and walking steadily. . Avoid periods of inactivity such as sitting longer than an hour when not asleep.  This helps prevent blood clots.  . You may discontinue the knee immobilizer once you are able to perform a straight leg raise while lying down. . You may resume a sexual relationship in one month or when given the OK by your doctor.  . You may return to work once you are cleared by your doctor.  . Do not drive a car for 6 weeks or until released by you surgeon.  . Do not drive while taking narcotics.  TED HOSE  STOCKINGS Wear the elastic stockings on both legs for three weeks following surgery during the day. You may remove them at night for sleeping.  WEIGHT BEARING Weight bearing as tolerated with assist device (walker, cane, etc) as directed, use it as long as suggested by your surgeon or therapist, typically at least 4-6 weeks.  POSTOPERATIVE CONSTIPATION PROTOCOL Constipation - defined medically as fewer than three stools per week and severe constipation as less than one stool per week.  One of the most common issues patients have following surgery is constipation.  Even if you have a regular bowel pattern at home, your normal regimen is likely to be disrupted due to multiple reasons following surgery.  Combination of anesthesia, postoperative narcotics, change in appetite and fluid intake all can affect your bowels.  In order to avoid complications following surgery, here are some recommendations in order to help you during your recovery period.  . Colace (docusate) - Pick up an over-the-counter form of Colace or another stool softener and take twice a day as long as you are requiring postoperative pain medications.  Take with a full glass of water daily.  If you experience loose stools or diarrhea, hold the colace until you stool forms back up. If your symptoms do not get better within 1 week or if they get worse, check with your doctor. . Dulcolax (bisacodyl) - Pick up over-the-counter and take as directed by the product packaging as needed to assist with the movement of your bowels.  Take with a full glass of water.  Use this product as needed if not relieved by Colace only.  . MiraLax (polyethylene glycol) - Pick up over-the-counter to have on hand. MiraLax is a solution that will increase the amount of water in your bowels to assist with bowel movements.  Take as directed and can mix with a glass of water, juice, soda, coffee, or tea. Take if you go more than two days without a movement. Do not use  MiraLax more than once per day. Call your doctor if you are still constipated or irregular after using this medication for 7 days in a row.  If you continue to have problems with postoperative constipation, please contact the office for further assistance and recommendations.  If you experience "the worst abdominal pain ever" or develop nausea or vomiting, please contact the office immediatly for further recommendations for treatment.  ITCHING If you experience itching with your medications, try taking only a single pain pill, or even half a pain pill at a time.  You can also use Benadryl over the counter for itching or also to help with sleep.   MEDICATIONS See your medication summary on the "After Visit Summary" that the nursing staff will review with you prior to discharge.  You may have some home medications which will be placed on hold until you complete the course of blood thinner medication.  It is important for you to complete the blood thinner medication as prescribed by your surgeon.  Continue your approved medications as instructed at time of discharge.  PRECAUTIONS . If you experience chest pain or shortness of breath - call 911 immediately for transfer to the hospital emergency department.  . If you develop a fever greater that 101 F, purulent drainage from wound, increased redness or drainage from wound, foul odor from the wound/dressing, or calf pain - CONTACT YOUR SURGEON.                                                   FOLLOW-UP APPOINTMENTS Make sure you keep all of your appointments after your operation with your surgeon and caregivers. You should call the office at the above phone number and make an appointment for approximately two weeks after the date of your surgery or on the date instructed by your surgeon outlined in the "After Visit Summary".  RANGE OF MOTION AND STRENGTHENING EXERCISES  Rehabilitation of the knee is important following a knee injury or an operation.  After just a few days of immobilization, the muscles of the thigh which control the knee become weakened and shrink (atrophy). Knee exercises are designed to build up the tone and strength of the thigh muscles and to improve knee motion. Often times heat used for twenty to thirty minutes before working out will loosen up your tissues and help with improving the range of motion but do not use heat for the first two weeks following surgery. These exercises can be done on a training (exercise) mat, on the floor, on a table or on a bed. Use what ever works the best and is most comfortable for you Knee exercises include:  . Leg Lifts - While your knee is still immobilized in a splint or cast, you can do straight leg raises. Lift the leg to 60 degrees, hold for 3 sec, and slowly lower the leg. Repeat 10-20 times 2-3 times daily. Perform this exercise against resistance later as your knee gets better.  Javier Docker and Hamstring Sets - Tighten up the muscle on the front of the thigh (Quad) and hold for 5-10 sec. Repeat this 10-20 times hourly. Hamstring sets are done by pushing the foot backward against an object and holding for 5-10 sec. Repeat as with quad sets.   Leg Slides: Lying on your back, slowly slide your foot toward your buttocks, bending your knee up off the floor (only go as far as is comfortable). Then slowly slide your foot back down until your leg is flat on the floor again.  Angel Wings: Lying on your back spread your legs to the side as far apart as you can without causing discomfort.  A rehabilitation program following serious knee injuries can speed recovery and prevent re-injury in the future due to weakened muscles. Contact your doctor or a physical therapist for more information on knee rehabilitation.   IF YOU ARE TRANSFERRED TO A SKILLED REHAB FACILITY If the patient is transferred to a skilled rehab facility following release from the hospital, a list of the current medications will be sent to  the facility for the patient to continue.  When discharged from the skilled rehab facility, please have the facility set up the patient's [place] prior to being released. Also, the skilled facility will be responsible for providing the patient with their medications at time of release from the  facility to include their pain medication, the muscle relaxants, and their blood thinner medication. If the patient is still at the rehab facility at time of the two week follow up appointment, the skilled rehab facility will also need to assist the patient in arranging follow up appointment in our office and any transportation needs.  MAKE SURE YOU:  . Understand these instructions.  . Get help right away if you are not doing well or get worse.    Pick up stool softner and laxative for home use following surgery while on pain medications. Do not submerge incision under water. Please use good hand washing techniques while changing dressing each day. May shower starting three days after surgery. Please use a clean towel to pat the incision dry following showers. Continue to use ice for pain and swelling after surgery. Do not use any lotions or creams on the incision until instructed by your surgeon.  Information on my medicine - XARELTO (Rivaroxaban)  Why was Xarelto prescribed for you? Xarelto was prescribed for you to reduce the risk of blood clots forming after orthopedic surgery. The medical term for these abnormal blood clots is venous thromboembolism (VTE).  What do you need to know about xarelto ? Take your Xarelto ONCE DAILY at the same time every day. You may take it either with or without food.  If you have difficulty swallowing the tablet whole, you may crush it and mix in applesauce just prior to taking your dose.  Take Xarelto exactly as prescribed by your doctor and DO NOT stop taking Xarelto without talking to the doctor who prescribed the medication.   Stopping without other VTE prevention medication to take the place of Xarelto may increase your risk of developing a clot.  After discharge, you should have regular check-up appointments with your healthcare provider that is prescribing your Xarelto.    What do you do if you miss a dose? If you miss a dose, take it as soon as you remember on the same day then continue your regularly scheduled once daily regimen the next day. Do not take two doses of Xarelto on the same day.   Important Safety Information A possible side effect of Xarelto is bleeding. You should call your healthcare provider right away if you experience any of the following: ? Bleeding from an injury or your nose that does not stop. ? Unusual colored urine (red or dark brown) or unusual colored stools (red or black). ? Unusual bruising for unknown reasons. ? A serious fall or if you hit your head (even if there is no bleeding).  Some medicines may interact with Xarelto and might increase your risk of bleeding while on Xarelto. To help avoid this, consult your healthcare provider or pharmacist prior to using any new prescription or non-prescription medications, including herbals, vitamins, non-steroidal anti-inflammatory drugs (NSAIDs) and supplements.  This website has more information on Xarelto: [email or web address]

## 2019-07-26 NOTE — Progress Notes (Signed)
Assisted Dr. Moser with left, ultrasound guided, adductor canal block. Side rails up, monitors on throughout procedure. See vital signs in flow sheet. Tolerated Procedure well.  

## 2019-07-26 NOTE — Care Management Obs Status (Signed)
[facility] NOTIFICATION   Patient Details  Name: Kristina Rowland MRN: [id number] Date of Birth: January 30, 1951   Medicare Observation Status Notification Given:  Yes    Erenest Rasher, RN 07/26/2019, 6:51 PM

## 2019-07-26 NOTE — Transfer of Care (Signed)
Immediate Anesthesia Transfer of Care Note  Patient: Kristina Rowland  Procedure(s) Performed: TOTAL KNEE ARTHROPLASTY (Left Knee)  Patient Location: PACU  Anesthesia Type:Regional  Level of Consciousness: awake, alert  and oriented  Airway & Oxygen Therapy: Patient Spontanous Breathing and Patient connected to face mask oxygen  Post-op Assessment: Report given to RN and Post -op Vital signs reviewed and stable  Post vital signs: Reviewed and stable  Last Vitals:  Vitals Value Taken Time  BP 102/59 07/26/19 1518  Temp    Pulse 65 07/26/19 1519  Resp 11 07/26/19 1519  SpO2 100 % 07/26/19 1519  Vitals shown include unvalidated device data.  Last Pain:  Vitals:   07/26/19 1327  TempSrc:   PainSc: 0-No pain      Patients Stated Pain Goal: 4 ([id number])  Complications: No apparent anesthesia complications

## 2019-07-26 NOTE — Anesthesia Procedure Notes (Signed)
Spinal  Patient location during procedure: OR Start time: 07/26/2019 1:39 PM End time: 07/26/2019 1:45 PM Staffing Anesthesiologist: Oleta Mouse, MD Preanesthetic Checklist Completed: patient identified, IV checked, site marked, risks and benefits discussed, surgical consent, monitors and equipment checked, pre-op evaluation and timeout performed Spinal Block Patient position: sitting Prep: DuraPrep Patient monitoring: heart rate, cardiac monitor, continuous pulse ox and blood pressure Approach: midline Location: L4-5 Injection technique: single-shot Needle Needle type: Sprotte  Needle gauge: 24 G Needle length: 9 cm Assessment Sensory level: T4

## 2019-07-27 DIAGNOSIS — M1712 Unilateral primary osteoarthritis, left knee: Secondary | ICD-10-CM | POA: Diagnosis not present

## 2019-07-27 LAB — BASIC METABOLIC PANEL
Anion gap: 7 (ref 5–15)
BUN: 13 mg/dL (ref 8–23)
CO2: 25 mmol/L (ref 22–32)
Calcium: 8.2 mg/dL — ABNORMAL LOW (ref 8.9–10.3)
Chloride: 108 mmol/L (ref 98–111)
Creatinine, Ser: 0.59 mg/dL (ref 0.44–1.00)
GFR calc Af Amer: >60 mL/min (ref >60)
GFR calc non Af Amer: >60 mL/min (ref >60)
Glucose, Bld: 127 mg/dL — ABNORMAL HIGH (ref 70–99)
Potassium: 4.6 mmol/L (ref 3.5–5.1)
Sodium: 140 mmol/L (ref 135–145)

## 2019-07-27 LAB — CBC
HCT: 39 % (ref 36.0–46.0)
Hemoglobin: 12.7 g/dL (ref 12.0–15.0)
MCH: 29.6 pg (ref 26.0–34.0)
MCHC: 32.6 g/dL (ref 30.0–36.0)
MCV: 90.9 fL (ref 80.0–100.0)
Platelets: 213 10*3/uL (ref 150–400)
RBC: 4.29 MIL/uL (ref 3.87–5.11)
RDW: 13.2 % (ref 11.5–15.5)
WBC: 8.2 10*3/uL (ref 4.0–10.5)
nRBC: 0 % (ref 0.0–0.2)

## 2019-07-27 MED ORDER — RIVAROXABAN 10 MG PO TABS: 10.0000 mg | ORAL_TABLET | Freq: Every day | ORAL | 0 refills | Status: DC

## 2019-07-27 MED ORDER — GABAPENTIN 300 MG PO CAPS: ORAL_CAPSULE | ORAL | 0 refills | Status: DC

## 2019-07-27 MED ORDER — OXYCODONE HCL 5 MG PO TABS: 5.0000 mg | ORAL_TABLET | Freq: Four times a day (QID) | ORAL | 0 refills | Status: DC | PRN

## 2019-07-27 MED ORDER — METHOCARBAMOL 500 MG PO TABS: 500.0000 mg | ORAL_TABLET | Freq: Four times a day (QID) | ORAL | 0 refills | Status: AC | PRN

## 2019-07-27 NOTE — Progress Notes (Signed)
Patient discharged to home w/ friend. Given all belongings, instructions, equipment. Verbalized understanding of all instructions. Escorted to pov via w/c. °

## 2019-07-27 NOTE — Progress Notes (Signed)
Physical Therapy Treatment Patient Details Name: Kristina Rowland MRN: [id number] DOB: 01-29-1951 Today's Date: 07/27/2019    History of Present Illness Patient is 69 y.o. female s/p Lt TKA on 07/24/20 with PMH significant for HTN, depression, hip pain, leukemia, OA, L4-5 decompression/fusion.    PT Comments    Pt performed LE exercises and ambulated again in hallway.  Pt provided with HEP and stair handouts.  Pt feels ready for d/c home today.   Follow Up Recommendations  Follow surgeon's recommendation for DC plan and follow-up therapies     Equipment Recommendations  Rolling walker with 5" wheels    Recommendations for Other Services       Precautions / Restrictions Precautions Precautions: Fall;Knee Restrictions Weight Bearing Restrictions: No    Mobility  Bed Mobility Overal bed mobility: Needs Assistance Bed Mobility: Supine to Sit;Sit to Supine     Supine to sit: Supervision Sit to supine: Supervision   General bed mobility comments: no assist required, pt requried extra time  Transfers Overall transfer level: Needs assistance Equipment used: Rolling walker (2 wheeled) Transfers: Sit to/from Stand Sit to Stand: Min guard         General transfer comment: min/guard for safety; cues for hand placement  Ambulation/Gait Ambulation/Gait assistance: Min guard Gait Distance (Feet): 80 Feet Assistive device: Rolling walker (2 wheeled) Gait Pattern/deviations: Step-to pattern;Decreased stance time - left;Decreased stride length;Antalgic Gait velocity: decreased   General Gait Details: verbal cues for sequence, RW positioning, step length   Stairs     Wheelchair Mobility    Modified Rankin (Stroke Patients Only)       Balance                                            Cognition Arousal/Alertness: Awake/alert Behavior During Therapy: WFL for tasks assessed/performed Overall Cognitive Status: Within Functional Limits for tasks  assessed                                        Exercises Total Joint Exercises Ankle Circles/Pumps: AROM;Both;10 reps Quad Sets: AROM;Left;10 reps Short Arc QuadSinclair Ship;Left;10 reps Heel Slides: AAROM;Left;10 reps Hip ABduction/ADduction: AAROM;Left;10 reps Straight Leg Raises: AAROM;Left;10 reps    General Comments        Pertinent Vitals/Pain Pain Assessment: 0-10 Pain Score: 6  Pain Location: Lt knee Pain Descriptors / Indicators: Aching;Sore;Burning Pain Intervention(s): Monitored during session;Repositioned;Premedicated before session    Home Living                      Prior Function            PT Goals (current goals can now be found in the care plan section) Progress towards PT goals: Progressing toward goals    Frequency    7X/week      PT Plan Current plan remains appropriate    Co-evaluation              AM-PAC PT "6 Clicks" Mobility   Outcome Measure  Help needed turning from your back to your side while in a flat bed without using bedrails?: None Help needed moving from lying on your back to sitting on the side of a flat bed without using bedrails?: A Little Help needed moving to and from  a bed to a chair (including a wheelchair)?: A Little Help needed standing up from a chair using your arms (e.g., wheelchair or bedside chair)?: A Little Help needed to walk in hospital room?: A Little Help needed climbing 3-5 steps with a railing? : A Little 6 Click Score: 19    End of Session Equipment Utilized During Treatment: Gait belt Activity Tolerance: Patient tolerated treatment well Patient left: in bed;with call bell/phone within reach;with family/visitor present Nurse Communication: Mobility status PT Visit Diagnosis: Muscle weakness (generalized) (M62.81);Difficulty in walking, not elsewhere classified (R26.2)     Time: [id number] PT Time Calculation (min) (ACUTE ONLY): 17 min  Charges:   $Therapeutic  Exercise: 8-22 mins                    Arlyce Dice, DPT Acute Rehabilitation Services Office: [phone]  York Ram E 07/27/2019, 4:10 PM

## 2019-07-27 NOTE — Progress Notes (Signed)
   Subjective: 1 Day Post-Op Procedure(s) (LRB): TOTAL KNEE ARTHROPLASTY (Left) Patient reports pain as moderate.   Patient seen in rounds by Dr. Wynelle Link. Patient is well, and has had no acute complaints or problems other than pain in the left knee. Denies chest pain, SOB, or calf pain. Foley catheter to be removed this AM. No issues overnight.  We will continue therapy today, ambulated 60' yesterday afternoon.  Objective: Vital signs in last 24 hours: Temp:  [97.5 F (36.4 C)-98.5 F (36.9 C)] 97.8 F (36.6 C) (02/23 0530) Pulse Rate:  [58-92] 82 (02/23 0530) Resp:  [12-18] 18 (02/23 0530) BP: (102-152)/(57-83) 119/67 (02/23 0530) SpO2:  [92 %-100 %] 97 % (02/23 0530) Weight:  [93.7 kg] 93.7 kg (02/22 1814)  Intake/Output from previous day:  Intake/Output Summary (Last 24 hours) at 07/27/2019 0714 Last data filed at 07/27/2019 0600 Gross per 24 hour  Intake 3119.69 ml  Output 1910 ml  Net 1209.69 ml    Labs: Recent Labs    07/27/19 0412  HGB 12.7   Recent Labs    07/27/19 0412  WBC 8.2  RBC 4.29  HCT 39.0  PLT 213   Recent Labs    07/27/19 0412  NA 140  K 4.6  CL 108  CO2 25  BUN 13  CREATININE 0.59  GLUCOSE 127*  CALCIUM 8.2*   Exam: General - Patient is Alert and Oriented Extremity - Neurologically intact Neurovascular intact Sensation intact distally Dorsiflexion/Plantar flexion intact Dressing - dressing C/D/I Motor Function - intact, moving foot and toes well on exam.   Past Medical History:  Diagnosis Date  . Arthritis   . Cancer ([place])    leukemia  . 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 Depression   . Headache   . Hypertension     Assessment/Plan: 1 Day Post-Op Procedure(s) (LRB): TOTAL KNEE ARTHROPLASTY (Left) Principal Problem:   OA (osteoarthritis) of knee Active Problems:   S/P total knee arthroplasty, left  Estimated body mass index is 34.38 kg/m as calculated from the following:   Height as  of this encounter: 5\' 5"  (1.651 m).   Weight as of this encounter: 93.7 kg. Advance diet Up with therapy D/C IV fluids  Anticipated LOS equal to or greater than 2 midnights due to - Age 20 and older with one or more of the following:  - Obesity  - Expected need for hospital services (PT, OT, Nursing) required for safe  discharge  - Anticipated need for postoperative skilled nursing care or inpatient rehab  - Active co-morbidities: Chronic pain requiring opiods OR   - Unanticipated findings during/Post Surgery: None  - Patient is a high risk of re-admission due to: None    DVT Prophylaxis - Xarelto Weight bearing as tolerated. D/C O2 and pulse ox and try on room air. Hemovac pulled without difficulty, will continue therapy today.  Plan is to go Home after hospital stay. Possible discharge this afternoon if meeting goals with therapy and pain under control with PO meds.  Scheduled for outpatient physical therapy at [facility]. Follow-up in the office in 2 weeks.   Theresa Duty, PA-C Orthopedic Surgery 07/27/2019, 7:14 AM

## 2019-07-27 NOTE — Progress Notes (Signed)
Physical Therapy Treatment Patient Details Name: Kristina Rowland MRN: [id number] DOB: 1951-02-13 Today's Date: 07/27/2019    History of Present Illness Patient is 69 y.o. female s/p Lt TKA on 07/24/20 with PMH significant for HTN, depression, hip pain, leukemia, OA, L4-5 decompression/fusion.    PT Comments    Pt assisted with ambulating in hallway and practiced safe stair technique.  Pt with hemovac site bleeding so returned to recliner and requested RN reinforce.  Will return to ambulate once more and review exercises.   Follow Up Recommendations  Follow surgeon's recommendation for DC plan and follow-up therapies     Equipment Recommendations  Rolling walker with 5" wheels    Recommendations for Other Services       Precautions / Restrictions Precautions Precautions: Fall;Knee Restrictions Weight Bearing Restrictions: No    Mobility  Bed Mobility Overal bed mobility: Needs Assistance Bed Mobility: Supine to Sit     Supine to sit: HOB elevated;Supervision     General bed mobility comments: no assist required, pt requried extra time  Transfers Overall transfer level: Needs assistance Equipment used: Rolling walker (2 wheeled) Transfers: Sit to/from Stand Sit to Stand: Min guard         General transfer comment: min/guard for safety; cues for hand placement  Ambulation/Gait Ambulation/Gait assistance: Min guard Gait Distance (Feet): 120 Feet Assistive device: Rolling walker (2 wheeled) Gait Pattern/deviations: Step-to pattern;Decreased stance time - left;Decreased stride length;Antalgic Gait velocity: decreased   General Gait Details: verbal cues for sequence, RW positioning, step length   Stairs Stairs: Yes Stairs assistance: Min guard Stair Management: Step to pattern;Forwards;One rail Right;With cane Number of Stairs: 2 General stair comments: verbal cues for sequencing and safety; pt used rail and SPC, performed x3   Wheelchair Mobility     Modified Rankin (Stroke Patients Only)       Balance                                            Cognition Arousal/Alertness: Awake/alert Behavior During Therapy: WFL for tasks assessed/performed Overall Cognitive Status: Within Functional Limits for tasks assessed                                        Exercises      General Comments        Pertinent Vitals/Pain Pain Assessment: 0-10 Pain Score: 4  Pain Location: Lt knee Pain Descriptors / Indicators: Aching;Sore;Burning Pain Intervention(s): Repositioned;Premedicated before session;Monitored during session    Home Living                      Prior Function            PT Goals (current goals can now be found in the care plan section) Progress towards PT goals: Progressing toward goals    Frequency    7X/week      PT Plan Current plan remains appropriate    Co-evaluation              AM-PAC PT "6 Clicks" Mobility   Outcome Measure  Help needed turning from your back to your side while in a flat bed without using bedrails?: None Help needed moving from lying on your back to sitting on the side of a flat  bed without using bedrails?: A Little Help needed moving to and from a bed to a chair (including a wheelchair)?: A Little Help needed standing up from a chair using your arms (e.g., wheelchair or bedside chair)?: A Little Help needed to walk in hospital room?: A Little Help needed climbing 3-5 steps with a railing? : A Little 6 Click Score: 19    End of Session Equipment Utilized During Treatment: Gait belt Activity Tolerance: Patient tolerated treatment well Patient left: with chair alarm set;in chair;with call bell/phone within reach Nurse Communication: Mobility status PT Visit Diagnosis: Muscle weakness (generalized) (M62.81);Difficulty in walking, not elsewhere classified (R26.2)     Time: [id number] PT Time Calculation (min) (ACUTE ONLY): 21  min  Charges:  $Gait Training: 8-22 mins                     Arlyce Dice, DPT Acute Rehabilitation Services Office: [phone]   Trena Platt 07/27/2019, 4:06 PM

## 2019-07-28 ENCOUNTER — Encounter: Payer: Self-pay | Admitting: *Deleted

## 2019-08-02 NOTE — Discharge Summary (Signed)
Physician Discharge Summary   Patient ID: Kristina Rowland MRN: [id number] DOB/AGE: 1950/07/31 69 y.o.  Admit date: 07/26/2019 Discharge date: 07/27/2019  Primary Diagnosis: Osteoarthritis, left knee   Admission Diagnoses:  Past Medical History:  Diagnosis Date  . Arthritis   . Cancer ([place])    leukemia  . 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 Depression   . Headache   . Hypertension    Discharge Diagnoses:   Principal Problem:   OA (osteoarthritis) of knee Active Problems:   S/P total knee arthroplasty, left  Estimated body mass index is 34.38 kg/m as calculated from the following:   Height as of this encounter: 5\' 5"  (1.651 m).   Weight as of this encounter: 93.7 kg.  Procedure:  Procedure(s) (LRB): TOTAL KNEE ARTHROPLASTY (Left)   Consults: None  HPI: Kristina Rowland is a 69 y.o. year old female with end stage OA of her left knee with progressively worsening pain and dysfunction. She has constant pain, with activity and at rest and significant functional deficits with difficulties even with ADLs. She has had extensive non-op management including analgesics, injections of cortisone and viscosupplements, and home exercise program, but remains in significant pain with significant dysfunction. Radiographs show bone on bone arthritis medial and patellofemoral. She presents now for left Total Knee Arthroplasty.  Laboratory Data: Admission on 07/26/2019, Discharged on 07/27/2019  Component Date Value Ref Range Status  . WBC 07/27/2019 8.2  4.0 - 10.5 K/uL Final  . RBC 07/27/2019 4.29  3.87 - 5.11 MIL/uL Final  . Hemoglobin 07/27/2019 12.7  12.0 - 15.0 g/dL Final  . HCT 07/27/2019 39.0  36.0 - 46.0 % Final  . MCV 07/27/2019 90.9  80.0 - 100.0 fL Final  . MCH 07/27/2019 29.6  26.0 - 34.0 pg Final  . MCHC 07/27/2019 32.6  30.0 - 36.0 g/dL Final  . RDW 07/27/2019 13.2  11.5 - 15.5 % Final  . Platelets 07/27/2019 213  150 - 400 K/uL Final  . nRBC  07/27/2019 0.0  0.0 - 0.2 % Final   Performed at [facility], [place] [address] [place], [place] [address]  . Sodium 07/27/2019 140  135 - 145 mmol/L Final  . Potassium 07/27/2019 4.6  3.5 - 5.1 mmol/L Final  . Chloride 07/27/2019 108  98 - 111 mmol/L Final  . CO2 07/27/2019 25  22 - 32 mmol/L Final  . Glucose, Bld 07/27/2019 127* 70 - 99 mg/dL Final  . BUN 07/27/2019 13  8 - 23 mg/dL Final  . Creatinine, Ser 07/27/2019 0.59  0.44 - 1.00 mg/dL Final  . Calcium 07/27/2019 8.2* 8.9 - 10.3 mg/dL Final  . GFR calc non Af Amer 07/27/2019 >60  >60 mL/min Final  . GFR calc Af Amer 07/27/2019 >60  >60 mL/min Final  . Anion gap 07/27/2019 7  5 - 15 Final   Performed at [facility], [place] [address], [place], [place] [address]  Hospital Outpatient Visit on 07/21/2019  Component Date Value Ref Range Status  . aPTT 07/21/2019 29  24 - 36 seconds Final   Performed at [facility], [place] [address], [place],  [address]  . WBC 07/21/2019 5.7  4.0 - 10.5 K/uL Final  . RBC 07/21/2019 4.96  3.87 - 5.11 MIL/uL Final  . Hemoglobin 07/21/2019 14.6  12.0 - 15.0 g/dL Final  . HCT 07/21/2019 43.9  36.0 - 46.0 % Final  .  MCV 07/21/2019 88.5  80.0 - 100.0 fL Final  . MCH 07/21/2019 29.4  26.0 - 34.0 pg Final  . MCHC 07/21/2019 33.3  30.0 - 36.0 g/dL Final  . RDW 07/21/2019 12.9  11.5 - 15.5 % Final  . Platelets 07/21/2019 234  150 - 400 K/uL Final  . nRBC 07/21/2019 0.0  0.0 - 0.2 % Final   Performed at [facility], [place] [address], [place], [place] [address]  . Sodium 07/21/2019 140  135 - 145 mmol/L Final  . Potassium 07/21/2019 3.5  3.5 - 5.1 mmol/L Final  . Chloride 07/21/2019 108  98 - 111 mmol/L Final  . CO2 07/21/2019 22  22 - 32 mmol/L Final  . Glucose, Bld 07/21/2019 99  70 - 99 mg/dL Final  . BUN 07/21/2019 14  8 - 23 mg/dL Final  . Creatinine, Ser 07/21/2019 0.64  0.44 - 1.00 mg/dL Final  . Calcium 07/21/2019 8.9  8.9  - 10.3 mg/dL Final  . Total Protein 07/21/2019 6.8  6.5 - 8.1 g/dL Final  . Albumin 07/21/2019 4.0  3.5 - 5.0 g/dL Final  . AST 07/21/2019 17  15 - 41 U/L Final  . ALT 07/21/2019 17  0 - 44 U/L Final  . Alkaline Phosphatase 07/21/2019 84  38 - 126 U/L Final  . Total Bilirubin 07/21/2019 1.1  0.3 - 1.2 mg/dL Final  . GFR calc non Af Amer 07/21/2019 >60  >60 mL/min Final  . GFR calc Af Amer 07/21/2019 >60  >60 mL/min Final  . Anion gap 07/21/2019 10  5 - 15 Final   Performed at [facility], [place] [address] [place], [place] [address]  . Prothrombin Time 07/21/2019 12.4  11.4 - 15.2 seconds Final  . INR 07/21/2019 0.9  0.8 - 1.2 Final   Comment: (NOTE) INR goal varies based on device and disease states. Performed at [facility], [place] [address] [place], [place] [address]   . ABO/RH(D) 07/21/2019 O POS   Final  . Antibody Screen 07/21/2019 NEG   Final  . Sample Expiration 07/21/2019 07/29/2019,2359   Final  . Extend sample reason 07/21/2019    Final                   Value:NO TRANSFUSIONS OR PREGNANCY IN THE PAST 3 MONTHS Performed at [place] [address] [place], [place] [address]   . MRSA, PCR 07/21/2019 NEGATIVE  NEGATIVE Final  . Staphylococcus aureus 07/21/2019 NEGATIVE  NEGATIVE Final   Comment: (NOTE) The Xpert SA Assay (FDA approved for NASAL specimens in patients 63 years of age and older), is one component of a comprehensive surveillance program. It is not intended to diagnose infection nor to guide or monitor treatment. Performed at [facility], [place] [address], [place], [place] [address]   . ABO/RH(D) 07/21/2019    Final                   Value:O POS Performed at [facility], [place] [address] [place], [place] [address]   Hospital Outpatient Visit on 07/21/2019  Component Date Value Ref Range Status  . SARS Coronavirus 2 07/21/2019 NEGATIVE  NEGATIVE Final    Comment: (NOTE) SARS-CoV-2 target nucleic acids are NOT DETECTED. The SARS-CoV-2 RNA is generally detectable in upper and lower respiratory specimens during the acute phase of infection. Negative results do not preclude SARS-CoV-2 infection, do not rule out co-infections with other  pathogens, and should not be used as the sole basis for treatment or other patient management decisions. Negative results must be combined with clinical observations, patient history, and epidemiological information. The expected result is Negative. Fact Sheet for Patients: SugarRoll.be Fact Sheet for Healthcare Providers: https://www.woods-mathews.com/ This test is not yet approved or cleared by the [place] FDA and  has been authorized for detection and/or diagnosis of SARS-CoV-2 by FDA under an Emergency Use Authorization (EUA). This EUA will remain  in effect (meaning this test can be used) for the duration of the COVID-19 declaration under Section 56                          4(b)(1) of the Act, 21 U.S.C. section 360bbb-3(b)(1), unless the authorization is terminated or revoked sooner. Performed at [facility], [place] [address] [place], [place] [address]      X-Rays:No results found.  EKG: Orders placed or performed during the hospital encounter of 07/21/19  . EKG 12-Lead  . EKG 12-Lead     Hospital Course: Kristina Rowland is a 69 y.o. who was admitted to [facility]. They were brought to the operating room on 07/26/2019 and underwent Procedure(s): TOTAL KNEE ARTHROPLASTY.  Patient tolerated the procedure well and was later transferred to the recovery room and then to the orthopaedic floor for postoperative care. They were given PO and IV analgesics for pain control following their surgery. They were given 24 hours of postoperative antibiotics of  Anti-infectives (From admission, onward)   Start     Dose/Rate Route Frequency Ordered Stop    07/27/19 0000  vancomycin (VANCOCIN) IVPB 1000 mg/200 mL premix     1,000 mg 200 mL/hr over 60 Minutes Intravenous Every 12 hours 07/26/19 1646 07/27/19 0025   07/26/19 1115  vancomycin (VANCOCIN) IVPB 1000 mg/200 mL premix     1,000 mg 200 mL/hr over 60 Minutes Intravenous On call to O.R. 07/26/19 1106 07/26/19 1316     and started on DVT prophylaxis in the form of Xarelto.   Kristina Rowland and OT were ordered for total joint protocol. Discharge planning consulted to help with postop disposition and equipment needs.  Patient had a good night on the evening of surgery. They started to get up OOB with therapy on POD #0. Kristina Rowland was seen during rounds and was ready to go home pending progress with therapy. Hemovac drain was pulled without difficulty. She worked with therapy on POD #1 and was meeting her goals. Kristina Rowland was discharged to home later that day in stable condition.  Diet: Regular diet Activity: WBAT Follow-up: in 2 weeks Disposition: Home with outpatient physical therapy at [facility] Kristina Rowland Discharged Condition: stable   Discharge Instructions    Call MD / Call 911   Complete by: As directed    If you experience chest pain or shortness of breath, CALL 911 and be transported to the hospital emergency room.  If you develope a fever above 101 F, pus (white drainage) or increased drainage or redness at the wound, or calf pain, call your surgeon's office.   Change dressing   Complete by: As directed    Change dressing on Wednesday, then change the dressing daily with sterile 4 x 4 inch gauze dressing and apply TED hose.   Constipation Prevention   Complete by: As directed    Drink plenty of fluids.  Prune juice may be helpful.  You may use a stool softener, such  as Colace (over the counter) 100 mg twice a day.  Use MiraLax (over the counter) for constipation as needed.   Diet - low sodium heart healthy   Complete by: As directed    Do not put a pillow under the knee. Place it under the heel.   Complete by: As  directed    Driving restrictions   Complete by: As directed    No driving for two weeks   TED hose   Complete by: As directed    Use stockings (TED hose) for three weeks on both leg(s).  You may remove them at night for sleeping.   Weight bearing as tolerated   Complete by: As directed      Allergies as of 07/27/2019      Reactions   Cephalosporins Anaphylaxis, Swelling, Rash   Has patient had a PCN reaction causing immediate rash, facial/tongue/throat swelling, SOB or lightheadedness with hypotension: Yes Has patient had a PCN reaction causing severe rash involving mucus membranes or skin necrosis: Yes Has patient had a PCN reaction that required hospitalization:WAS [place] Has patient had a PCN reaction occurring within the last 10 years: No If all of the above answers are "NO", then may proceed with Cephalosporin use.   Penicillins Anaphylaxis, Swelling, Rash   Has patient had a PCN reaction causing immediate rash, facial/tongue/throat swelling, SOB or lightheadedness with hypotension: Yes Has patient had a PCN reaction causing severe rash involving mucus membranes or skin necrosis: Yes Has patient had a PCN reaction that required hospitalization:No Has patient had a PCN reaction occurring within the last 10 years: No If all of the above answers are "NO", then may proceed with Cephalosporin use.      Medication List    STOP taking these medications   Biotin 10000 MCG Tabs   Vitamin D3 250 MCG (10000 UT) Tabs     TAKE these medications   acyclovir 800 MG tablet Commonly known as: ZOVIRAX Take 800 mg by mouth 4 (four) times daily as needed (fever blisters.).   amphetamine-dextroamphetamine 25 MG 24 hr capsule Commonly known as: ADDERALL XR Take 25 mg by mouth daily.   Buprenorphine HCl-Naloxone HCl 2-0.5 MG Film Place 0.125 strips under the tongue at bedtime. Take 1/8 of a strip   DULoxetine 60 MG capsule Commonly known as: CYMBALTA Take 60  mg by mouth daily.   estradiol 1 MG tablet Commonly known as: ESTRACE Take 0.3333 mg by mouth at bedtime.   gabapentin 300 MG capsule Commonly known as: NEURONTIN Take a 300 mg capsule three times a day for two weeks following surgery.Then take a 300 mg capsule two times a day for two weeks. Then take a 300 mg capsule once a day for two weeks. Then discontinue.   lisinopril 20 MG tablet Commonly known as: ZESTRIL Take 20 mg by mouth at bedtime.   methocarbamol 500 MG tablet Commonly known as: ROBAXIN Take 1 tablet (500 mg total) by mouth every 6 (six) hours as needed for muscle spasms.   oxyCODONE 5 MG immediate release tablet Commonly known as: Oxy IR/ROXICODONE Take 1-2 tablets (5-10 mg total) by mouth every 6 (six) hours as needed for moderate pain or severe pain (pain score 4-6).   rivaroxaban 10 MG Tabs tablet Commonly known as: XARELTO Take 1 tablet (10 mg total) by mouth daily with breakfast for 20 days. Then take one 81 mg aspirin once a day for three weeks. Then discontinue aspirin.   topiramate 25  MG tablet Commonly known as: TOPAMAX Take 25 mg by mouth at bedtime.            Discharge Care Instructions  (From admission, onward)         Start     Ordered   07/27/19 0000  Weight bearing as tolerated     07/27/19 0720   07/27/19 0000  Change dressing    Comments: Change dressing on Wednesday, then change the dressing daily with sterile 4 x 4 inch gauze dressing and apply TED hose.   07/27/19 0720         Follow-up Information    Gaynelle Arabian, MD. Schedule an appointment as soon as possible for a visit in 2 weeks.   Specialty: Orthopedic Surgery Contact information: [address] [place] [address] [phone]           Signed: Theresa Duty, PA-C Orthopedic Surgery 08/02/2019, 7:42 AM

## 2020-04-25 ENCOUNTER — Ambulatory Visit: Payer: Self-pay | Admitting: Orthopedic Surgery

## 2020-05-02 ENCOUNTER — Encounter (HOSPITAL_COMMUNITY): Payer: Self-pay

## 2020-05-02 ENCOUNTER — Other Ambulatory Visit (HOSPITAL_COMMUNITY)
Admission: RE | Admit: 2020-05-02 | Discharge: 2020-05-02 | Disposition: A | Discharge status: Home / Self Care | Payer: Medicare Other | Source: Ambulatory Visit | Attending: Specialist | Admitting: Specialist

## 2020-05-02 ENCOUNTER — Encounter (HOSPITAL_COMMUNITY)
Admission: RE | Admit: 2020-05-02 | Discharge: 2020-05-02 | Disposition: A | Discharge status: Home / Self Care | Payer: Medicare Other | Source: Ambulatory Visit | Attending: Specialist | Admitting: Specialist

## 2020-05-02 ENCOUNTER — Ambulatory Visit: Payer: Self-pay | Admitting: Orthopedic Surgery

## 2020-05-02 ENCOUNTER — Ambulatory Visit (HOSPITAL_COMMUNITY)
Admission: RE | Admit: 2020-05-02 | Discharge: 2020-05-02 | Disposition: A | Discharge status: Home / Self Care | Payer: Medicare Other | Source: Ambulatory Visit | Attending: Orthopedic Surgery | Admitting: Orthopedic Surgery

## 2020-05-02 ENCOUNTER — Other Ambulatory Visit: Payer: Self-pay

## 2020-05-02 DIAGNOSIS — Z20822 Contact with and (suspected) exposure to covid-19: Secondary | ICD-10-CM | POA: Insufficient documentation

## 2020-05-02 DIAGNOSIS — Z01812 Encounter for preprocedural laboratory examination: Secondary | ICD-10-CM | POA: Insufficient documentation

## 2020-05-02 DIAGNOSIS — M5126 Other intervertebral disc displacement, lumbar region: Secondary | ICD-10-CM | POA: Insufficient documentation

## 2020-05-02 HISTORY — DX: Anxiety disorder, unspecified: F41.9

## 2020-05-02 HISTORY — DX: Anemia, unspecified: D64.9

## 2020-05-02 LAB — CBC
HCT: 47.1 % — ABNORMAL HIGH (ref 36.0–46.0)
Hemoglobin: 15.7 g/dL — ABNORMAL HIGH (ref 12.0–15.0)
MCH: 30.3 pg (ref 26.0–34.0)
MCHC: 33.3 g/dL (ref 30.0–36.0)
MCV: 90.8 fL (ref 80.0–100.0)
Platelets: 285 10*3/uL (ref 150–400)
RBC: 5.19 MIL/uL — ABNORMAL HIGH (ref 3.87–5.11)
RDW: 13 % (ref 11.5–15.5)
WBC: 5.9 10*3/uL (ref 4.0–10.5)
nRBC: 0 % (ref 0.0–0.2)

## 2020-05-02 LAB — BASIC METABOLIC PANEL
Anion gap: 12 (ref 5–15)
BUN: 11 mg/dL (ref 8–23)
CO2: 24 mmol/L (ref 22–32)
Calcium: 8.4 mg/dL — ABNORMAL LOW (ref 8.9–10.3)
Chloride: 105 mmol/L (ref 98–111)
Creatinine, Ser: 0.76 mg/dL (ref 0.44–1.00)
GFR, Estimated: >60 mL/min (ref >60)
Glucose, Bld: 115 mg/dL — ABNORMAL HIGH (ref 70–99)
Potassium: 3.4 mmol/L — ABNORMAL LOW (ref 3.5–5.1)
Sodium: 141 mmol/L (ref 135–145)

## 2020-05-02 LAB — SURGICAL PCR SCREEN
MRSA, PCR: NEGATIVE
Staphylococcus aureus: NEGATIVE

## 2020-05-02 LAB — SARS CORONAVIRUS 2 (TAT 6-24 HRS): SARS Coronavirus 2: NEGATIVE

## 2020-05-02 NOTE — H&P (View-Only) (Signed)
Kristina Rowland is an 69 y.o. female.   Chief Complaint: back and bilateral leg pain HPI: Reported by patient. Reason for Visit: Diagnositc Results (lumbar MRI) Context: The patient is 5 months out from the onset Location (Lower Extremity): lower back pain bilateral; leg pain bilateral, , Severity: pain level 9/10 Quality: sharp; aching; dull Aggravating Factors: standing for ; going from sit to stand Associated Symptoms: numbness/tingling; weakness (BLE) Medications: The patient is taking Ibuprofen and Robaxin prn.  Past Medical History:  Diagnosis Date  . Anemia   . Anxiety   . Arthritis   . Cancer ([place])    leukemia  . 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 Depression   . Headache   . Hypertension     Past Surgical History:  Procedure Laterality Date  . ABDOMINAL HYSTERECTOMY    . APPENDECTOMY    . BACK SURGERY    . BONE MARROW HARVEST    . CATARACT EXTRACTION, BILATERAL    . [place]  . EYE SURGERY    . JOINT REPLACEMENT Left 07/2019  . KNEE ARTHROSCOPY WITH MENISCAL REPAIR     left and right  . LUMBAR LAMINECTOMY/DECOMPRESSION MICRODISCECTOMY N/A 10/09/2017   Procedure: MICROLUMBAR DECOMPRESSION LUMBAR 4-5;  Surgeon: Susa Day, MD;  Location: [place];  Service: Orthopedics;  Laterality: N/A;  . TONSILLECTOMY    . TOTAL KNEE ARTHROPLASTY Left 07/26/2019   Procedure: TOTAL KNEE ARTHROPLASTY;  Surgeon: Gaynelle Arabian, MD;  Location: [facility];  Service: Orthopedics;  Laterality: Left;  37min    No family history on file. Social History:  reports that she quit smoking about 35 years ago. Her smoking use included cigarettes. She has never used smokeless tobacco. She reports current alcohol use. She reports that she does not use drugs.  Allergies:  Allergies  Allergen Reactions  . Cephalosporins Anaphylaxis, Swelling and Rash    Has patient had a PCN reaction causing immediate rash, facial/tongue/throat swelling, SOB or  lightheadedness with hypotension: Yes Has patient had a PCN reaction causing severe rash involving mucus membranes or skin necrosis: Yes Has patient had a PCN reaction that required hospitalization:[facility] [place] Has patient had a PCN reaction occurring within the last 10 years: No If all of the above answers are "NO", then may proceed with Cephalosporin use.   Marland Kitchen Penicillins Anaphylaxis, Swelling and Rash    Has patient had a PCN reaction causing immediate rash, facial/tongue/throat swelling, SOB or lightheadedness with hypotension: Yes Has patient had a PCN reaction causing severe rash involving mucus membranes or skin necrosis: Yes Has patient had a PCN reaction that required hospitalization:No Has patient had a PCN reaction occurring within the last 10 years: No If all of the above answers are "NO", then may proceed with Cephalosporin use.    . Gabapentin     Cloudy headed    Meds: acyclovir 800 mg tablet atomoxetine 80 mg capsule buprenorphine 2 mg-naloxone 0.5 mg sublingual film ciprofloxacin 500 mg tablet dextroamphetamine-amphetamine ER 25 mg 24hr capsule,extend releases dicyclomine 10 mg capsule diphenoxylate-atropine 2.5 mg-0.025 mg tablet DULoxetine 30 mg capsule,delayed release DULoxetine 60 mg capsule,delayed release estradioL 1 mg tablet fluconazole 150 mg tablet ibuprofen lisinopriL 20 mg tablet meloxicam 7.5 mg tablet methocarbamoL 500 mg tablet mupirocin 2 % topical ointment Neupro 1 mg/24 hour transdermal 24 hour patch nitrofurantoin monohydrate/macrocrystals 100 mg capsule nystatin 100,000 unit/mL oral suspension oxyCODONE-acetaminophen 5 mg-325 mg tablet pentoxifylline ER 400  mg tablet,extended release promethazine 25 mg tablet rosuvastatin 10 mg tablet topiramate 25 mg tablet topiramate 50 mg tablet  Review of Systems  Constitutional: Negative.   HENT: Negative.   Eyes: Negative.   Respiratory: Negative.   Cardiovascular:  Negative.   Gastrointestinal: Negative.   Endocrine: Negative.   Genitourinary: Negative.   Musculoskeletal: Positive for back pain.  Skin: Negative.   Neurological: Positive for weakness and numbness.    There were no vitals taken for this visit. Physical Exam Constitutional:      Appearance: Normal appearance.  HENT:     Head: Normocephalic.     Right Ear: External ear normal.     Left Ear: External ear normal.     Nose: Nose normal.     Mouth/Throat:     Mouth: Mucous membranes are moist.  Cardiovascular:     Rate and Rhythm: Normal rate and regular rhythm.     Pulses: Normal pulses.     Heart sounds: Normal heart sounds.  Pulmonary:     Effort: Pulmonary effort is normal.  Abdominal:     General: Abdomen is flat.  Musculoskeletal:     Cervical back: Normal range of motion.     Comments: Patient is a 69 year old female.  Gait and Station: Appearance: ambulating with no assistive devices and antalgic gait.  Constitutional: General Appearance: healthy-appearing and distress (mild).  Psychiatric: Mood and Affect: active and alert.  Cardiovascular System: Edema Right: none; Dorsalis and posterior tibial pulses 2+. Edema Left: none.  Abdomen: Inspection and Palpation: non-distended and no tenderness.  Skin: Inspection and palpation: no rash.  Lumbar Spine: Inspection: normal alignment. Bony Palpation of the Lumbar Spine: tender at lumbosacral junction.. Bony Palpation of the Right Hip: no tenderness of the greater trochanter and tenderness of the SI joint; Pelvis stable. Bony Palpation of the Left Hip: no tenderness of the greater trochanter and tenderness of the SI joint. Soft Tissue Palpation on the Right: No flank pain with percussion. Active Range of Motion: limited flexion and extention.  Motor Strength: L1 Motor Strength on the Right: hip flexion iliopsoas 5/5. L1 Motor Strength on the Left: hip flexion iliopsoas 5/5. L2-L4 Motor Strength on the Right: knee extension  quadriceps 5/5. L2-L4 Motor Strength on the Left: knee extension quadriceps 5/5. L5 Motor Strength on the Right: ankle dorsiflexion tibialis anterior 5/5 and great toe extension extensor hallucis longus 5/5. L5 Motor Strength on the Left: ankle dorsiflexion tibialis anterior 5/5 and great toe extension extensor hallucis longus 5/5. S1 Motor Strength on the Right: plantar flexion gastrocnemius 5/5. S1 Motor Strength on the Left: plantar flexion gastrocnemius 5/5.  Neurological System: Knee Reflex Right: normal (2). Knee Reflex Left: normal (2). Ankle Reflex Right: normal (2). Ankle Reflex Left: normal (2). Babinski Reflex Right: plantar reflex absent. Babinski Reflex Left: plantar reflex absent. Sensation on the Right: normal distal extremities. Sensation on the Left: normal distal extremities. Special Tests on the Right: no clonus of the ankle/knee and seated straight leg raising test positive. Special Tests on the Left: no clonus of the ankle/knee and seated straight leg raising test positive.  Straight leg raise low back and buttock pain  Neurological:     Mental Status: She is alert.    MRI demonstrates a large synovial cyst emanating from the L3-4 facet on the left. A small 1 on the right. There is moderately severe stenosis secondary to this. Previous decompression L4-5. 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  Specifically discussing spinal stenosis. I also discussed the natural history and the progression of spinal stenosis. That the spinal canal dimensions increase with flexion and sitting while it decreases with  extension such as standing straight in one position or lying flat on one's back or stomach.  The concepts of avoiding extension and favoring flexion throughout their activities of daily living to avoid neural compression were discussed. Also utilization of a stepstool while standing or sitting to tilt the pelvis to open up the spinal canal as well as gently leaning forward both with walking and with activity. Use of hiking sticks versus a rolling walker in more severe cases to promote forward flexion while walking. Exercise utilizing a stationary or recumbent bike to allow for more room inside the spinal canal. Avoiding extension such as direct overhead work as well as Radio producer on a hard surface. Utilizing a wedge underneath the knees while sleeping on their back and a pillow between the knees when sleeping on their sides. And that typically a softer mattress is more accommodating than a hard one.  I discussed an aspiration of the facet on the left at L3-4 with Dr. Herma Mering. And reviewed the MRI with him. He will perform this on Thursday. If we can decompress the cyst then this should help her therapeutically. If this does not help we discussed a lumbar decompression L3-4  I had an extensive discussion with the patient concerning the pathology relevant anatomy and treatment options. At this point exhausting conservative treatment and in the presence of a neurologic deficit we discussed microlumbar decompression. I discussed the risks and benefits including bleeding, infection, DVT, PE, anesthetic complications, worsening in their symptoms, improvement in their symptoms, C SF leakage, epidural fibrosis, need for future surgeries such as revision discectomy and lumbar fusion. I also indicated that this is an operation to basically decompress the nerve root to allow recovery as opposed to fixing a herniated disc and that the incidence of recurrent chest disc herniation can approach 15%. Also that nerve root  recovery is variable and may not recover completely.  I discussed the operative course including overnight in the hospital. Immediate ambulation. Follow-up in 2 weeks for suture removal. 6 weeks until healing of the herniation followed by 6 weeks of reconditioning and strengthening of the core musculature. Also discussed the need to employ the concepts of disc pressure management and core motion following the surgery to minimize the risk of recurrent disc herniation. We will obtain preoperative clearance i if necessary and proceed accordingly.  Patient is to call or present to the emergency room if there is any numbness or weakness to suggest worsening of their condition. In addition although rare if there is loss of bowel or bladder function such as incontinence or inability to void that this may represent a cauda equina syndrome. If noted the patient is to present immediately to the emergency room for evaluation and treatment otherwise permanent bowel or bladder dysfunction can occur as a result.  A prescription for an opioid was given to be taken as directed for pain control. I discussed the risks including cognitive changes which may affect the ability to operate machinery and to drive. In addition the side effect of  constipation as well as to avoid taking with conflicting medications that were discussed. The patient's prescription drug monitoring report was reviewed with no red flags noted.  I would like to see her back after the injection. We will start in the process of looking for OR time if this does not help.  Patient did not improve and desires to proceed with surgery  Plan microlumbar decompression L3-4, possible L2-3, revision L4-5  Cecilie Kicks, PA-C for Dr. Tonita Cong 05/02/2020, 1:41 PM

## 2020-05-02 NOTE — H&P (Signed)
Kristina Rowland is an 69 y.o. female.   Chief Complaint: back and bilateral leg pain HPI: Reported by patient. Reason for Visit: Diagnositc Results (lumbar MRI) Context: The patient is 5 months out from the onset Location (Lower Extremity): lower back pain bilateral; leg pain bilateral, , Severity: pain level 9/10 Quality: sharp; aching; dull Aggravating Factors: standing for ; going from sit to stand Associated Symptoms: numbness/tingling; weakness (BLE) Medications: The patient is taking Ibuprofen and Robaxin prn.  Past Medical History:  Diagnosis Date  . Anemia   . Anxiety   . Arthritis   . Cancer ([place])    leukemia  . 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 Depression   . Headache   . Hypertension     Past Surgical History:  Procedure Laterality Date  . ABDOMINAL HYSTERECTOMY    . APPENDECTOMY    . BACK SURGERY    . BONE MARROW HARVEST    . CATARACT EXTRACTION, BILATERAL    . [place]  . EYE SURGERY    . JOINT REPLACEMENT Left 07/2019  . KNEE ARTHROSCOPY WITH MENISCAL REPAIR     left and right  . LUMBAR LAMINECTOMY/DECOMPRESSION MICRODISCECTOMY N/A 10/09/2017   Procedure: MICROLUMBAR DECOMPRESSION LUMBAR 4-5;  Surgeon: Susa Day, MD;  Location: [place];  Service: Orthopedics;  Laterality: N/A;  . TONSILLECTOMY    . TOTAL KNEE ARTHROPLASTY Left 07/26/2019   Procedure: TOTAL KNEE ARTHROPLASTY;  Surgeon: Gaynelle Arabian, MD;  Location: [facility];  Service: Orthopedics;  Laterality: Left;  7min    No family history on file. Social History:  reports that she quit smoking about 35 years ago. Her smoking use included cigarettes. She has never used smokeless tobacco. She reports current alcohol use. She reports that she does not use drugs.  Allergies:  Allergies  Allergen Reactions  . Cephalosporins Anaphylaxis, Swelling and Rash    Has patient had a PCN reaction causing immediate rash, facial/tongue/throat swelling, SOB or  lightheadedness with hypotension: Yes Has patient had a PCN reaction causing severe rash involving mucus membranes or skin necrosis: Yes Has patient had a PCN reaction that required hospitalization:[facility] [place] Has patient had a PCN reaction occurring within the last 10 years: No If all of the above answers are "NO", then may proceed with Cephalosporin use.   Marland Kitchen Penicillins Anaphylaxis, Swelling and Rash    Has patient had a PCN reaction causing immediate rash, facial/tongue/throat swelling, SOB or lightheadedness with hypotension: Yes Has patient had a PCN reaction causing severe rash involving mucus membranes or skin necrosis: Yes Has patient had a PCN reaction that required hospitalization:No Has patient had a PCN reaction occurring within the last 10 years: No If all of the above answers are "NO", then may proceed with Cephalosporin use.    . Gabapentin     Cloudy headed    Meds: acyclovir 800 mg tablet atomoxetine 80 mg capsule buprenorphine 2 mg-naloxone 0.5 mg sublingual film ciprofloxacin 500 mg tablet dextroamphetamine-amphetamine ER 25 mg 24hr capsule,extend releases dicyclomine 10 mg capsule diphenoxylate-atropine 2.5 mg-0.025 mg tablet DULoxetine 30 mg capsule,delayed release DULoxetine 60 mg capsule,delayed release estradioL 1 mg tablet fluconazole 150 mg tablet ibuprofen lisinopriL 20 mg tablet meloxicam 7.5 mg tablet methocarbamoL 500 mg tablet mupirocin 2 % topical ointment Neupro 1 mg/24 hour transdermal 24 hour patch nitrofurantoin monohydrate/macrocrystals 100 mg capsule nystatin 100,000 unit/mL oral suspension oxyCODONE-acetaminophen 5 mg-325 mg tablet pentoxifylline ER 400  mg tablet,extended release promethazine 25 mg tablet rosuvastatin 10 mg tablet topiramate 25 mg tablet topiramate 50 mg tablet  Review of Systems  Constitutional: Negative.   HENT: Negative.   Eyes: Negative.   Respiratory: Negative.   Cardiovascular:  Negative.   Gastrointestinal: Negative.   Endocrine: Negative.   Genitourinary: Negative.   Musculoskeletal: Positive for back pain.  Skin: Negative.   Neurological: Positive for weakness and numbness.    There were no vitals taken for this visit. Physical Exam Constitutional:      Appearance: Normal appearance.  HENT:     Head: Normocephalic.     Right Ear: External ear normal.     Left Ear: External ear normal.     Nose: Nose normal.     Mouth/Throat:     Mouth: Mucous membranes are moist.  Cardiovascular:     Rate and Rhythm: Normal rate and regular rhythm.     Pulses: Normal pulses.     Heart sounds: Normal heart sounds.  Pulmonary:     Effort: Pulmonary effort is normal.  Abdominal:     General: Abdomen is flat.  Musculoskeletal:     Cervical back: Normal range of motion.     Comments: Patient is a 69 year old female.  Gait and Station: Appearance: ambulating with no assistive devices and antalgic gait.  Constitutional: General Appearance: healthy-appearing and distress (mild).  Psychiatric: Mood and Affect: active and alert.  Cardiovascular System: Edema Right: none; Dorsalis and posterior tibial pulses 2+. Edema Left: none.  Abdomen: Inspection and Palpation: non-distended and no tenderness.  Skin: Inspection and palpation: no rash.  Lumbar Spine: Inspection: normal alignment. Bony Palpation of the Lumbar Spine: tender at lumbosacral junction.. Bony Palpation of the Right Hip: no tenderness of the greater trochanter and tenderness of the SI joint; Pelvis stable. Bony Palpation of the Left Hip: no tenderness of the greater trochanter and tenderness of the SI joint. Soft Tissue Palpation on the Right: No flank pain with percussion. Active Range of Motion: limited flexion and extention.  Motor Strength: L1 Motor Strength on the Right: hip flexion iliopsoas 5/5. L1 Motor Strength on the Left: hip flexion iliopsoas 5/5. L2-L4 Motor Strength on the Right: knee extension  quadriceps 5/5. L2-L4 Motor Strength on the Left: knee extension quadriceps 5/5. L5 Motor Strength on the Right: ankle dorsiflexion tibialis anterior 5/5 and great toe extension extensor hallucis longus 5/5. L5 Motor Strength on the Left: ankle dorsiflexion tibialis anterior 5/5 and great toe extension extensor hallucis longus 5/5. S1 Motor Strength on the Right: plantar flexion gastrocnemius 5/5. S1 Motor Strength on the Left: plantar flexion gastrocnemius 5/5.  Neurological System: Knee Reflex Right: normal (2). Knee Reflex Left: normal (2). Ankle Reflex Right: normal (2). Ankle Reflex Left: normal (2). Babinski Reflex Right: plantar reflex absent. Babinski Reflex Left: plantar reflex absent. Sensation on the Right: normal distal extremities. Sensation on the Left: normal distal extremities. Special Tests on the Right: no clonus of the ankle/knee and seated straight leg raising test positive. Special Tests on the Left: no clonus of the ankle/knee and seated straight leg raising test positive.  Straight leg raise low back and buttock pain  Neurological:     Mental Status: She is alert.    MRI demonstrates a large synovial cyst emanating from the L3-4 facet on the left. A small 1 on the right. There is moderately severe stenosis secondary to this. Previous decompression L4-5. 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  Specifically discussing spinal stenosis. I also discussed the natural history and the progression of spinal stenosis. That the spinal canal dimensions increase with flexion and sitting while it decreases with  extension such as standing straight in one position or lying flat on one's back or stomach.  The concepts of avoiding extension and favoring flexion throughout their activities of daily living to avoid neural compression were discussed. Also utilization of a stepstool while standing or sitting to tilt the pelvis to open up the spinal canal as well as gently leaning forward both with walking and with activity. Use of hiking sticks versus a rolling walker in more severe cases to promote forward flexion while walking. Exercise utilizing a stationary or recumbent bike to allow for more room inside the spinal canal. Avoiding extension such as direct overhead work as well as Radio producer on a hard surface. Utilizing a wedge underneath the knees while sleeping on their back and a pillow between the knees when sleeping on their sides. And that typically a softer mattress is more accommodating than a hard one.  I discussed an aspiration of the facet on the left at L3-4 with Dr. Herma Mering. And reviewed the MRI with him. He will perform this on Thursday. If we can decompress the cyst then this should help her therapeutically. If this does not help we discussed a lumbar decompression L3-4  I had an extensive discussion with the patient concerning the pathology relevant anatomy and treatment options. At this point exhausting conservative treatment and in the presence of a neurologic deficit we discussed microlumbar decompression. I discussed the risks and benefits including bleeding, infection, DVT, PE, anesthetic complications, worsening in their symptoms, improvement in their symptoms, C SF leakage, epidural fibrosis, need for future surgeries such as revision discectomy and lumbar fusion. I also indicated that this is an operation to basically decompress the nerve root to allow recovery as opposed to fixing a herniated disc and that the incidence of recurrent chest disc herniation can approach 15%. Also that nerve root  recovery is variable and may not recover completely.  I discussed the operative course including overnight in the hospital. Immediate ambulation. Follow-up in 2 weeks for suture removal. 6 weeks until healing of the herniation followed by 6 weeks of reconditioning and strengthening of the core musculature. Also discussed the need to employ the concepts of disc pressure management and core motion following the surgery to minimize the risk of recurrent disc herniation. We will obtain preoperative clearance i if necessary and proceed accordingly.  Patient is to call or present to the emergency room if there is any numbness or weakness to suggest worsening of their condition. In addition although rare if there is loss of bowel or bladder function such as incontinence or inability to void that this may represent a cauda equina syndrome. If noted the patient is to present immediately to the emergency room for evaluation and treatment otherwise permanent bowel or bladder dysfunction can occur as a result.  A prescription for an opioid was given to be taken as directed for pain control. I discussed the risks including cognitive changes which may affect the ability to operate machinery and to drive. In addition the side effect of  constipation as well as to avoid taking with conflicting medications that were discussed. The patient's prescription drug monitoring report was reviewed with no red flags noted.  I would like to see her back after the injection. We will start in the process of looking for OR time if this does not help.  Patient did not improve and desires to proceed with surgery  Plan microlumbar decompression L3-4, possible L2-3, revision L4-5  Cecilie Kicks, PA-C for Dr. Tonita Cong 05/02/2020, 1:41 PM

## 2020-05-02 NOTE — Progress Notes (Signed)
PCP - Dr. Nash Mantis @ [facility] Cardiologist - Denies  PPM/ICD - Denies  Chest/Lumber x-ray - 05/02/20 EKG - 05/02/20  Stress Test - Denies ECHO - Denies Cardiac Cath - Denies  Sleep Study - Denies  Patient denies having diabetes.  Blood Thinner Instructions: N/A Aspirin Instructions: N/A  ERAS Protcol - Yes PRE-SURGERY Ensure - Yes  COVID TEST- 05/02/20   Coronavirus Screening  Have you experienced the following symptoms:  Cough yes/no: No Fever (>100.19F)  yes/no: No Runny nose yes/no: No Sore throat yes/no: No Difficulty breathing/shortness of breath  yes/no: No  Have you or a family member traveled in the last 14 days and where? yes/no: No   If the patient indicates "YES" to the above questions, their PAT will be rescheduled to limit the exposure to others and, the surgeon will be notified. THE PATIENT WILL NEED TO BE ASYMPTOMATIC FOR 14 DAYS.   If the patient is not experiencing any of these symptoms, the PAT nurse will instruct them to NOT bring anyone with them to their appointment since they may have these symptoms or traveled as well.   Please remind your patients and families that hospital visitation restrictions are in effect and the importance of the restrictions.     Anesthesia review: Yes, abnormal EKG  Patient denies shortness of breath, fever, cough and chest pain at PAT appointment   All instructions explained to the patient, with a verbal understanding of the material. Patient agrees to go over the instructions while at home for a better understanding. Patient also instructed to self quarantine after being tested for COVID-19. The opportunity to ask questions was provided.

## 2020-05-02 NOTE — Progress Notes (Signed)
Your procedure is scheduled on Thursday, May 04, 2020.  Report to [facility] Main Entrance "A" at 8:30 A.M., and check in at the Admitting office.  Call this number if you have problems the morning of surgery:  [phone]  Call [phone] if you have any questions prior to your surgery date Monday-Friday 8am-4pm    Remember:  Do not eat after midnight the night before your surgery  You may drink clear liquids until 7:30 AM the morning of your surgery.   Clear liquids allowed are: Water, Non-Citrus Juices (without pulp), Carbonated Beverages, Clear Tea, Black Coffee Only, and Gatorade  Please complete your PRE-SURGERY ENSURE that was provided to you by 7:30 AM the morning of surgery.  Please, if able, drink it in one setting. DO NOT SIP.    Take these medicines the morning of surgery with A SIP OF WATER:  atomoxetine (STRATTERA) DULoxetine (CYMBALTA)  IF NEEDED: acyclovir (ZOVIRAX) diphenhydrAMINE (BENADRYL) methocarbamol (ROBAXIN)   As of today, STOP taking any Aspirin (unless otherwise instructed by your surgeon) Aleve, Naproxen, Motrin, ibuprofen (ADVIL) , Goody's, BC's, all herbal medications, fish oil, and all vitamins.                      Do not wear jewelry, make up, or nail polish            Do not wear lotions, powders, perfumes, or deodorant.            Do not shave 48 hours prior to surgery.            Do not bring valuables to the hospital.            [facility] is not responsible for any belongings or valuables.  Do NOT Smoke (Tobacco/Vaping) or drink Alcohol 24 hours prior to your procedure If you use a CPAP at night, you may bring all equipment for your overnight stay.   Contacts, glasses, dentures or bridgework may not be worn into surgery.      For patients admitted to the hospital, discharge time will be determined by your treatment team.   Patients discharged the day of surgery will not be allowed to drive home, and someone needs to stay with  them for 24 hours.    Special instructions:   [place]- Preparing For Surgery  Before surgery, you can play an important role. Because skin is not sterile, your skin needs to be as free of germs as possible. You can reduce the number of germs on your skin by washing with CHG (chlorahexidine gluconate) Soap before surgery.  CHG is an antiseptic cleaner which kills germs and bonds with the skin to continue killing germs even after washing.    Oral Hygiene is also important to reduce your risk of infection.  Remember - BRUSH YOUR TEETH THE MORNING OF SURGERY WITH YOUR REGULAR TOOTHPASTE  Please do not use if you have an allergy to CHG or antibacterial soaps. If your skin becomes reddened/irritated stop using the CHG.  Do not shave (including legs and underarms) for at least 48 hours prior to first CHG shower. It is OK to shave your face.  Please follow these instructions carefully.   1. Shower the NIGHT BEFORE SURGERY and the MORNING OF SURGERY with CHG Soap.   2. If you chose to wash your hair, wash your hair first as usual with your normal shampoo.  3. After you shampoo, rinse your hair and body thoroughly to remove  the shampoo.  4. Use CHG as you would any other liquid soap. You can apply CHG directly to the skin and wash gently with a scrungie or a clean washcloth.   5. Apply the CHG Soap to your body ONLY FROM THE NECK DOWN.  Do not use on open wounds or open sores. Avoid contact with your eyes, ears, mouth and genitals (private parts). Wash Face and genitals (private parts)  with your normal soap.   6. Wash thoroughly, paying special attention to the area where your surgery will be performed.  7. Thoroughly rinse your body with warm water from the neck down.  8. DO NOT shower/wash with your normal soap after using and rinsing off the CHG Soap.  9. Pat yourself dry with a CLEAN TOWEL.  10. Wear CLEAN PAJAMAS to bed the night before surgery  11. Place CLEAN SHEETS on your bed  the night of your first shower and DO NOT SLEEP WITH PETS.   Day of Surgery: Wear Clean/Comfortable clothing the morning of surgery Do not apply any deodorants/lotions.   Remember to brush your teeth WITH YOUR REGULAR TOOTHPASTE.   Please read over the following fact sheets that you were given.

## 2020-05-03 MED ORDER — GENTAMICIN SULFATE 40 MG/ML IJ SOLN
130.0000 mg | INTRAVENOUS | Status: DC
  Filled 2020-05-03: qty 3.25

## 2020-05-03 NOTE — Progress Notes (Signed)
Anesthesia Chart Review:  Case: [id number] Date/Time: 05/04/20 1016   Procedure: Microlumbar decompression L3-4, possible L2-3, Revision L4-5 (N/A ) - 2.5hrs   Anesthesia type: General   Pre-op diagnosis: Spinal stenosis L3-4   Location: [facility] OR ROOM 76 / [place] OR   Surgeons: Susa Day, MD      DISCUSSION: Patient is a 69 year old female scheduled for the above procedure.  History includes former smoker, HTN, anemia, leukemia, anxiety, back surgery (L4-5 laminotomies 10/09/17), left TKA (07/26/19). BMI is consistent with obesity.   05/02/2020 presurgical COVID-19 test negative.  Anesthesia team to evaluate on the day of surgery.  VS: BP (!) 160/82   Pulse 96   Temp 36.8 C (Oral)   Resp 18   Ht 5\' 5"  (1.651 m)   Wt 95.6 kg   SpO2 97%   BMI 35.06 kg/m   PROVIDERS: PCP is Dr. Nash Mantis @ [place]: Labs reviewed: Acceptable for surgery. (all labs ordered are listed, but only abnormal results are displayed)  Labs Reviewed  BASIC METABOLIC PANEL - Abnormal; Notable for the following components:      Result Value   Potassium 3.4 (*)    Glucose, Bld 115 (*)    Calcium 8.4 (*)    All other components within normal limits  CBC - Abnormal; Notable for the following components:   RBC 5.19 (*)    Hemoglobin 15.7 (*)    HCT 47.1 (*)    All other components within normal limits  SURGICAL PCR SCREEN     IMAGES: Xray L-spine 05/02/20: IMPRESSION: 1. Multilevel degenerative lumbar spondylosis with multilevel disc disease and facet disease. 2. Degenerative anterolisthesis of L3 and L4.   EKG: 05/02/20: Normal sinus rhythm Nonspecific ST abnormality Abnormal ECG Confirmed by Sherren Mocha [phone]) on 05/02/2020 10:18:44 PM   CV: N/A  Past Medical History:  Diagnosis Date  . Anemia   . Anxiety   . Arthritis   . Cancer ([place])    leukemia  . 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 Depression   . Headache   .  Hypertension     Past Surgical History:  Procedure Laterality Date  . ABDOMINAL HYSTERECTOMY    . APPENDECTOMY    . BACK SURGERY    . BONE MARROW HARVEST    . CATARACT EXTRACTION, BILATERAL    . [place]  . EYE SURGERY    . JOINT REPLACEMENT Left 07/2019  . KNEE ARTHROSCOPY WITH MENISCAL REPAIR     left and right  . LUMBAR LAMINECTOMY/DECOMPRESSION MICRODISCECTOMY N/A 10/09/2017   Procedure: MICROLUMBAR DECOMPRESSION LUMBAR 4-5;  Surgeon: Susa Day, MD;  Location: [place];  Service: Orthopedics;  Laterality: N/A;  . TONSILLECTOMY    . TOTAL KNEE ARTHROPLASTY Left 07/26/2019   Procedure: TOTAL KNEE ARTHROPLASTY;  Surgeon: Gaynelle Arabian, MD;  Location: [facility];  Service: Orthopedics;  Laterality: Left;  64min    MEDICATIONS: . acyclovir (ZOVIRAX) 800 MG tablet  . atomoxetine (STRATTERA) 80 MG capsule  . Biotin 10 MG CAPS  . Buprenorphine HCl-Naloxone HCl 2-0.5 MG FILM  . diphenhydrAMINE (BENADRYL) 25 MG tablet  . DULoxetine (CYMBALTA) 60 MG capsule  . estradiol (ESTRACE) 1 MG tablet  . ibuprofen (ADVIL) 200 MG tablet  . lisinopril (PRINIVIL,ZESTRIL) 20 MG tablet  . methocarbamol (ROBAXIN) 500 MG tablet   No current facility-administered medications for this encounter.    Myra Gianotti, PA-C Surgical  Short Stay/Anesthesiology [facility] Phone [phone] [facility] Phone [phone] 05/03/2020 8:43 AM

## 2020-05-03 NOTE — Anesthesia Preprocedure Evaluation (Addendum)
Anesthesia Evaluation  Patient identified by MRN, date of birth, ID band Patient awake    Reviewed: Patient's Chart, lab work & pertinent test results  Airway Mallampati: II  TM Distance: >3 FB Neck ROM: Full    Dental  (+) Teeth Intact   Pulmonary neg pulmonary ROS, former smoker,    Pulmonary exam normal        Cardiovascular hypertension, Pt. on medications  Rhythm:Regular Rate:Normal     Neuro/Psych  Headaches, Anxiety Depression    GI/Hepatic negative GI ROS, Neg liver ROS,   Endo/Other  negative endocrine ROS  Renal/GU negative Renal ROS  negative genitourinary   Musculoskeletal  (+) Arthritis , Osteoarthritis,    Abdominal (+)  Abdomen: soft. Bowel sounds: normal.  Peds  Hematology  (+) anemia , Leukemia    Anesthesia Other Findings   Reproductive/Obstetrics                            Anesthesia Physical Anesthesia Plan  ASA: II  Anesthesia Plan: General   Post-op Pain Management:    Induction: Intravenous  PONV Risk Score and Plan: 3 and Ondansetron, Dexamethasone and Treatment may vary due to age or medical condition  Airway Management Planned: Mask and Oral ETT  Additional Equipment: None  Intra-op Plan:   Post-operative Plan: Extubation in OR  Informed Consent: I have reviewed the patients History and Physical, chart, labs and discussed the procedure including the risks, benefits and alternatives for the proposed anesthesia with the patient or authorized representative who has indicated his/her understanding and acceptance.     Dental advisory given  Plan Discussed with: CRNA  Anesthesia Plan Comments: (PAT note written 05/03/2020 by Myra Gianotti, PA-C. Lab Results      Component                Value               Date                      WBC                      5.9                 05/02/2020                HGB                      15.7 (H)             05/02/2020                HCT                      47.1 (H)            05/02/2020                MCV                      90.8                05/02/2020                PLT                      285  05/02/2020          )       Anesthesia Quick Evaluation

## 2020-05-04 ENCOUNTER — Ambulatory Visit (HOSPITAL_COMMUNITY): Payer: Medicare Other | Admitting: Registered Nurse

## 2020-05-04 ENCOUNTER — Encounter (HOSPITAL_COMMUNITY)
Admission: RE | Disposition: A | Discharge status: Home / Self Care | Payer: Self-pay | Source: Home / Self Care | Attending: Specialist

## 2020-05-04 ENCOUNTER — Other Ambulatory Visit: Payer: Self-pay

## 2020-05-04 ENCOUNTER — Ambulatory Visit (HOSPITAL_COMMUNITY): Payer: Medicare Other | Admitting: Vascular Surgery

## 2020-05-04 ENCOUNTER — Ambulatory Visit (HOSPITAL_COMMUNITY): Payer: Medicare Other

## 2020-05-04 ENCOUNTER — Ambulatory Visit (HOSPITAL_COMMUNITY)
Admission: RE | Admit: 2020-05-04 | Discharge: 2020-05-05 | Disposition: A | Discharge status: Home / Self Care | Payer: Medicare Other | Attending: Specialist | Admitting: Specialist

## 2020-05-04 ENCOUNTER — Encounter (HOSPITAL_COMMUNITY): Payer: Self-pay | Admitting: Specialist

## 2020-05-04 DIAGNOSIS — M7138 Other bursal cyst, other site: Secondary | ICD-10-CM | POA: Diagnosis not present

## 2020-05-04 DIAGNOSIS — Z87891 Personal history of nicotine dependence: Secondary | ICD-10-CM | POA: Insufficient documentation

## 2020-05-04 DIAGNOSIS — M48061 Spinal stenosis, lumbar region without neurogenic claudication: Secondary | ICD-10-CM | POA: Diagnosis present

## 2020-05-04 DIAGNOSIS — Z419 Encounter for procedure for purposes other than remedying health state, unspecified: Secondary | ICD-10-CM

## 2020-05-04 DIAGNOSIS — Z888 Allergy status to other drugs, medicaments and biological substances status: Secondary | ICD-10-CM | POA: Insufficient documentation

## 2020-05-04 DIAGNOSIS — Z791 Long term (current) use of non-steroidal anti-inflammatories (NSAID): Secondary | ICD-10-CM | POA: Diagnosis not present

## 2020-05-04 DIAGNOSIS — Z881 Allergy status to other antibiotic agents status: Secondary | ICD-10-CM | POA: Diagnosis not present

## 2020-05-04 DIAGNOSIS — Z880 Allergy status to penicillin: Secondary | ICD-10-CM | POA: Insufficient documentation

## 2020-05-04 DIAGNOSIS — Z79891 Long term (current) use of opiate analgesic: Secondary | ICD-10-CM | POA: Diagnosis not present

## 2020-05-04 DIAGNOSIS — Z79899 Other long term (current) drug therapy: Secondary | ICD-10-CM | POA: Insufficient documentation

## 2020-05-04 HISTORY — PX: LUMBAR LAMINECTOMY/DECOMPRESSION MICRODISCECTOMY: SHX5026

## 2020-05-04 SURGERY — LUMBAR LAMINECTOMY/DECOMPRESSION MICRODISCECTOMY 3 LEVELS
Anesthesia: General | Site: Spine Lumbar

## 2020-05-04 MED ORDER — ACETAMINOPHEN 10 MG/ML IV SOLN
1000.0000 mg | INTRAVENOUS | Status: AC
  Administered 2020-05-04: 1000 mg via INTRAVENOUS
  Filled 2020-05-04: qty 100

## 2020-05-04 MED ORDER — METHOCARBAMOL 1000 MG/10ML IJ SOLN
500.0000 mg | Freq: Four times a day (QID) | INTRAVENOUS | Status: DC | PRN
  Filled 2020-05-04: qty 5

## 2020-05-04 MED ORDER — FENTANYL CITRATE (PF) 250 MCG/5ML IJ SOLN
INTRAMUSCULAR | Status: DC | PRN
  Administered 2020-05-04 (×2): 50 ug via INTRAVENOUS
  Administered 2020-05-04: 100 ug via INTRAVENOUS
  Administered 2020-05-04: 50 ug via INTRAVENOUS

## 2020-05-04 MED ORDER — PHENYLEPHRINE 40 MCG/ML (10ML) SYRINGE FOR IV PUSH (FOR BLOOD PRESSURE SUPPORT)
PREFILLED_SYRINGE | INTRAVENOUS | Status: DC | PRN
  Administered 2020-05-04: 80 ug via INTRAVENOUS

## 2020-05-04 MED ORDER — BISACODYL 5 MG PO TBEC: 5.0000 mg | DELAYED_RELEASE_TABLET | Freq: Every day | ORAL | Status: DC | PRN

## 2020-05-04 MED ORDER — LACTATED RINGERS IV SOLN: INTRAVENOUS | Status: DC

## 2020-05-04 MED ORDER — OXYCODONE HCL 5 MG PO TABS
10.0000 mg | ORAL_TABLET | ORAL | Status: DC | PRN
  Administered 2020-05-04 – 2020-05-05 (×5): 10 mg via ORAL
  Filled 2020-05-04 (×5): qty 2

## 2020-05-04 MED ORDER — VANCOMYCIN HCL IN DEXTROSE 1-5 GM/200ML-% IV SOLN
1000.0000 mg | Freq: Once | INTRAVENOUS | Status: AC
  Administered 2020-05-04: 1000 mg via INTRAVENOUS
  Filled 2020-05-04: qty 200

## 2020-05-04 MED ORDER — MAGNESIUM CITRATE PO SOLN: 1.0000 | Freq: Once | ORAL | Status: DC | PRN

## 2020-05-04 MED ORDER — DEXAMETHASONE SODIUM PHOSPHATE 10 MG/ML IJ SOLN
INTRAMUSCULAR | Status: DC | PRN
  Administered 2020-05-04: 10 mg via INTRAVENOUS

## 2020-05-04 MED ORDER — FENTANYL CITRATE (PF) 250 MCG/5ML IJ SOLN
INTRAMUSCULAR | Status: AC
  Filled 2020-05-04: qty 5

## 2020-05-04 MED ORDER — ROCURONIUM BROMIDE 10 MG/ML (PF) SYRINGE
PREFILLED_SYRINGE | INTRAVENOUS | Status: DC | PRN
  Administered 2020-05-04: 60 mg via INTRAVENOUS

## 2020-05-04 MED ORDER — 0.9 % SODIUM CHLORIDE (POUR BTL) OPTIME
TOPICAL | Status: DC | PRN
  Administered 2020-05-04: 1000 mL

## 2020-05-04 MED ORDER — ESTRADIOL 1 MG PO TABS
1.0000 mg | ORAL_TABLET | Freq: Every day | ORAL | Status: DC
  Administered 2020-05-04: 1 mg via ORAL
  Filled 2020-05-04: qty 1

## 2020-05-04 MED ORDER — BUPIVACAINE-EPINEPHRINE 0.5% -1:200000 IJ SOLN
INTRAMUSCULAR | Status: AC
  Filled 2020-05-04: qty 1

## 2020-05-04 MED ORDER — ALUM & MAG HYDROXIDE-SIMETH 200-200-20 MG/5ML PO SUSP: 30.0000 mL | Freq: Four times a day (QID) | ORAL | Status: DC | PRN

## 2020-05-04 MED ORDER — ACYCLOVIR 800 MG PO TABS
800.0000 mg | ORAL_TABLET | Freq: Three times a day (TID) | ORAL | Status: DC | PRN
  Filled 2020-05-04: qty 1

## 2020-05-04 MED ORDER — ONDANSETRON HCL 4 MG PO TABS: 4.0000 mg | ORAL_TABLET | Freq: Four times a day (QID) | ORAL | Status: DC | PRN

## 2020-05-04 MED ORDER — ACETAMINOPHEN 650 MG RE SUPP: 650.0000 mg | RECTAL | Status: DC | PRN

## 2020-05-04 MED ORDER — MIDAZOLAM HCL 2 MG/2ML IJ SOLN: 2.0000 mg | Freq: Once | INTRAMUSCULAR | Status: AC

## 2020-05-04 MED ORDER — ACETAMINOPHEN 325 MG PO TABS
650.0000 mg | ORAL_TABLET | ORAL | Status: DC | PRN
  Administered 2020-05-04 – 2020-05-05 (×2): 650 mg via ORAL
  Filled 2020-05-04 (×2): qty 2

## 2020-05-04 MED ORDER — MENTHOL 3 MG MT LOZG: 1.0000 | LOZENGE | OROMUCOSAL | Status: DC | PRN

## 2020-05-04 MED ORDER — LIDOCAINE 2% (20 MG/ML) 5 ML SYRINGE
INTRAMUSCULAR | Status: DC | PRN
  Administered 2020-05-04: 60 mg via INTRAVENOUS

## 2020-05-04 MED ORDER — MIDAZOLAM HCL 2 MG/2ML IJ SOLN
INTRAMUSCULAR | Status: AC
  Filled 2020-05-04: qty 2

## 2020-05-04 MED ORDER — MIDAZOLAM HCL 5 MG/5ML IJ SOLN
INTRAMUSCULAR | Status: DC | PRN
  Administered 2020-05-04: 2 mg via INTRAVENOUS

## 2020-05-04 MED ORDER — WHITE PETROLATUM EX OINT
TOPICAL_OINTMENT | CUTANEOUS | Status: AC
  Filled 2020-05-04: qty 28.35

## 2020-05-04 MED ORDER — ATOMOXETINE HCL 40 MG PO CAPS
80.0000 mg | ORAL_CAPSULE | Freq: Every day | ORAL | Status: DC
  Filled 2020-05-04: qty 2

## 2020-05-04 MED ORDER — GENTAMICIN SULFATE 40 MG/ML IJ SOLN
INTRAVENOUS | Status: DC | PRN
  Administered 2020-05-04: 130 mg via INTRAVENOUS

## 2020-05-04 MED ORDER — BUPIVACAINE-EPINEPHRINE 0.5% -1:200000 IJ SOLN
INTRAMUSCULAR | Status: DC | PRN
  Administered 2020-05-04: 4 mL

## 2020-05-04 MED ORDER — OXYCODONE HCL 5 MG PO TABS: 5.0000 mg | ORAL_TABLET | ORAL | Status: DC | PRN

## 2020-05-04 MED ORDER — BIOTIN 10 MG PO CAPS: 10.0000 mg | ORAL_CAPSULE | Freq: Every day | ORAL | Status: DC

## 2020-05-04 MED ORDER — VANCOMYCIN HCL IN DEXTROSE 1-5 GM/200ML-% IV SOLN
1000.0000 mg | INTRAVENOUS | Status: AC
  Administered 2020-05-04: 1000 mg via INTRAVENOUS
  Filled 2020-05-04: qty 200

## 2020-05-04 MED ORDER — PHENYLEPHRINE HCL-NACL 10-0.9 MG/250ML-% IV SOLN
INTRAVENOUS | Status: DC | PRN
  Administered 2020-05-04: 25 ug/min via INTRAVENOUS

## 2020-05-04 MED ORDER — HYDROMORPHONE HCL 1 MG/ML IJ SOLN
INTRAMUSCULAR | Status: AC
  Administered 2020-05-04: 0.5 mg via INTRAVENOUS
  Filled 2020-05-04: qty 1

## 2020-05-04 MED ORDER — ONDANSETRON HCL 4 MG/2ML IJ SOLN: 4.0000 mg | Freq: Once | INTRAMUSCULAR | Status: DC | PRN

## 2020-05-04 MED ORDER — DULOXETINE HCL 30 MG PO CPEP
60.0000 mg | ORAL_CAPSULE | Freq: Every day | ORAL | Status: DC
  Filled 2020-05-04: qty 2

## 2020-05-04 MED ORDER — DIPHENHYDRAMINE HCL 25 MG PO CAPS: 25.0000 mg | ORAL_CAPSULE | Freq: Every day | ORAL | Status: DC | PRN

## 2020-05-04 MED ORDER — RISAQUAD PO CAPS
1.0000 | ORAL_CAPSULE | Freq: Every day | ORAL | Status: DC
  Administered 2020-05-04: 1 via ORAL
  Filled 2020-05-04 (×2): qty 1

## 2020-05-04 MED ORDER — METHOCARBAMOL 500 MG PO TABS
500.0000 mg | ORAL_TABLET | Freq: Four times a day (QID) | ORAL | Status: DC | PRN
  Administered 2020-05-04 – 2020-05-05 (×3): 500 mg via ORAL
  Filled 2020-05-04 (×4): qty 1

## 2020-05-04 MED ORDER — ACETAMINOPHEN 10 MG/ML IV SOLN: 1000.0000 mg | Freq: Once | INTRAVENOUS | Status: DC | PRN

## 2020-05-04 MED ORDER — PROPOFOL 10 MG/ML IV BOLUS
INTRAVENOUS | Status: AC
  Filled 2020-05-04: qty 20

## 2020-05-04 MED ORDER — KCL IN DEXTROSE-NACL 20-5-0.45 MEQ/L-%-% IV SOLN: INTRAVENOUS | Status: DC

## 2020-05-04 MED ORDER — PROPOFOL 10 MG/ML IV BOLUS
INTRAVENOUS | Status: DC | PRN
  Administered 2020-05-04: 140 mg via INTRAVENOUS

## 2020-05-04 MED ORDER — LISINOPRIL 20 MG PO TABS: 20.0000 mg | ORAL_TABLET | Freq: Every day | ORAL | Status: DC

## 2020-05-04 MED ORDER — ONDANSETRON HCL 4 MG/2ML IJ SOLN
INTRAMUSCULAR | Status: DC | PRN
  Administered 2020-05-04: 4 mg via INTRAVENOUS

## 2020-05-04 MED ORDER — ONDANSETRON HCL 4 MG/2ML IJ SOLN
4.0000 mg | Freq: Four times a day (QID) | INTRAMUSCULAR | Status: DC | PRN
  Administered 2020-05-04: 4 mg via INTRAVENOUS
  Filled 2020-05-04: qty 2

## 2020-05-04 MED ORDER — POLYETHYLENE GLYCOL 3350 17 G PO PACK: 17.0000 g | PACK | Freq: Every day | ORAL | Status: DC | PRN

## 2020-05-04 MED ORDER — DOCUSATE SODIUM 100 MG PO CAPS
100.0000 mg | ORAL_CAPSULE | Freq: Two times a day (BID) | ORAL | Status: DC
  Administered 2020-05-04: 100 mg via ORAL
  Filled 2020-05-04: qty 1

## 2020-05-04 MED ORDER — PHENOL 1.4 % MT LIQD: 1.0000 | OROMUCOSAL | Status: DC | PRN

## 2020-05-04 MED ORDER — BUPRENORPHINE HCL-NALOXONE HCL 2-0.5 MG SL FILM: 0.1250 | ORAL_FILM | Freq: Every day | SUBLINGUAL | Status: DC

## 2020-05-04 MED ORDER — THROMBIN 20000 UNITS EX SOLR
CUTANEOUS | Status: DC | PRN
  Administered 2020-05-04: 20 mL via TOPICAL

## 2020-05-04 MED ORDER — MIDAZOLAM HCL 2 MG/2ML IJ SOLN
INTRAMUSCULAR | Status: AC
  Administered 2020-05-04: 2 mg via INTRAVENOUS
  Filled 2020-05-04: qty 2

## 2020-05-04 MED ORDER — THROMBIN 20000 UNITS EX SOLR
CUTANEOUS | Status: AC
  Filled 2020-05-04: qty 20000

## 2020-05-04 MED ORDER — SUGAMMADEX SODIUM 200 MG/2ML IV SOLN
INTRAVENOUS | Status: DC | PRN
  Administered 2020-05-04 (×2): 100 mg via INTRAVENOUS

## 2020-05-04 MED ORDER — HYDROMORPHONE HCL 1 MG/ML IJ SOLN
0.2500 mg | INTRAMUSCULAR | Status: DC | PRN
  Administered 2020-05-04: 0.5 mg via INTRAVENOUS

## 2020-05-04 MED ORDER — CHLORHEXIDINE GLUCONATE 0.12 % MT SOLN
15.0000 mL | Freq: Once | OROMUCOSAL | Status: AC
  Administered 2020-05-04: 15 mL via OROMUCOSAL
  Filled 2020-05-04: qty 15

## 2020-05-04 MED ORDER — ORAL CARE MOUTH RINSE: 15.0000 mL | Freq: Once | OROMUCOSAL | Status: AC

## 2020-05-04 SURGICAL SUPPLY — 58 items
BAG DECANTER FOR FLEXI CONT (MISCELLANEOUS) IMPLANT
BAND RUBBER #18 3X1/16 STRL (MISCELLANEOUS) ×6 IMPLANT
CLOSURE WOUND 1/2 X4 (GAUZE/BANDAGES/DRESSINGS) ×1
CNTNR URN SCR LID CUP LEK RST (MISCELLANEOUS) ×1 IMPLANT
CONT SPEC 4OZ STRL OR WHT (MISCELLANEOUS) ×2
COVER WAND RF STERILE (DRAPES) ×3 IMPLANT
DRAPE LAPAROTOMY 100X72X124 (DRAPES) ×3 IMPLANT
DRAPE MICROSCOPE LEICA (MISCELLANEOUS) ×3 IMPLANT
DRAPE SHEET LG 3/4 BI-LAMINATE (DRAPES) ×3 IMPLANT
DRAPE SURG 17X11 SM STRL (DRAPES) ×3 IMPLANT
DRAPE UTILITY XL STRL (DRAPES) ×3 IMPLANT
DRSG AQUACEL AG ADV 3.5X 4 (GAUZE/BANDAGES/DRESSINGS) IMPLANT
DRSG AQUACEL AG ADV 3.5X 6 (GAUZE/BANDAGES/DRESSINGS) IMPLANT
DRSG TELFA 3X8 NADH (GAUZE/BANDAGES/DRESSINGS) IMPLANT
DURAPREP 26ML APPLICATOR (WOUND CARE) ×3 IMPLANT
DURASEAL SPINE SEALANT 3ML (MISCELLANEOUS) IMPLANT
ELECT BLADE 4.0 EZ CLEAN MEGAD (MISCELLANEOUS) ×3
ELECT REM PT RETURN 9FT ADLT (ELECTROSURGICAL)
ELECTRODE BLDE 4.0 EZ CLN MEGD (MISCELLANEOUS) ×1 IMPLANT
ELECTRODE REM PT RTRN 9FT ADLT (ELECTROSURGICAL) IMPLANT
GLOVE BIO SURGEON STRL SZ 6.5 (GLOVE) ×2 IMPLANT
GLOVE BIO SURGEONS STRL SZ 6.5 (GLOVE) ×1
GLOVE BIOGEL PI IND STRL 6.5 (GLOVE) ×1 IMPLANT
GLOVE BIOGEL PI IND STRL 7.0 (GLOVE) ×2 IMPLANT
GLOVE BIOGEL PI INDICATOR 6.5 (GLOVE) ×2
GLOVE BIOGEL PI INDICATOR 7.0 (GLOVE) ×4
GLOVE SURG SS PI 7.5 STRL IVOR (GLOVE) ×18 IMPLANT
GLOVE SURG SS PI 8.0 STRL IVOR (GLOVE) ×6 IMPLANT
GOWN STRL REUS W/ TWL LRG LVL3 (GOWN DISPOSABLE) ×2 IMPLANT
GOWN STRL REUS W/ TWL XL LVL3 (GOWN DISPOSABLE) ×3 IMPLANT
GOWN STRL REUS W/TWL LRG LVL3 (GOWN DISPOSABLE) ×4
GOWN STRL REUS W/TWL XL LVL3 (GOWN DISPOSABLE) ×6
IV CATH 14GX2 1/4 (CATHETERS) ×3 IMPLANT
KIT BASIN OR (CUSTOM PROCEDURE TRAY) ×3 IMPLANT
KIT POSITION SURG JACKSON T1 (MISCELLANEOUS) IMPLANT
NEEDLE 22X1 1/2 (OR ONLY) (NEEDLE) ×3 IMPLANT
NEEDLE SPNL 18GX3.5 QUINCKE PK (NEEDLE) ×6 IMPLANT
PACK LAMINECTOMY NEURO (CUSTOM PROCEDURE TRAY) ×3 IMPLANT
PATTIES SURGICAL .5 X.5 (GAUZE/BANDAGES/DRESSINGS) ×3 IMPLANT
PATTIES SURGICAL .75X.75 (GAUZE/BANDAGES/DRESSINGS) ×3 IMPLANT
SPONGE LAP 4X18 RFD (DISPOSABLE) ×3 IMPLANT
SPONGE SURGIFOAM ABS GEL 100 (HEMOSTASIS) ×3 IMPLANT
STAPLER VISISTAT (STAPLE) IMPLANT
STRIP CLOSURE SKIN 1/2X4 (GAUZE/BANDAGES/DRESSINGS) ×2 IMPLANT
STRIP SURGICAL 1/4 X 6 IN (GAUZE/BANDAGES/DRESSINGS) ×3 IMPLANT
SUT NURALON 4 0 TR CR/8 (SUTURE) IMPLANT
SUT PROLENE 3 0 PS 2 (SUTURE) IMPLANT
SUT VIC AB 1 CT1 27 (SUTURE) ×2
SUT VIC AB 1 CT1 27XBRD ANTBC (SUTURE) ×1 IMPLANT
SUT VIC AB 1-0 CT2 27 (SUTURE) ×6 IMPLANT
SUT VIC AB 2-0 CT1 27 (SUTURE) ×2
SUT VIC AB 2-0 CT1 TAPERPNT 27 (SUTURE) ×1 IMPLANT
SUT VIC AB 2-0 CT2 27 (SUTURE) IMPLANT
SYR 3ML LL SCALE MARK (SYRINGE) ×3 IMPLANT
TOWEL GREEN STERILE (TOWEL DISPOSABLE) ×3 IMPLANT
TOWEL GREEN STERILE FF (TOWEL DISPOSABLE) ×3 IMPLANT
TRAY FOLEY MTR SLVR 16FR STAT (SET/KITS/TRAYS/PACK) ×3 IMPLANT
YANKAUER SUCT BULB TIP NO VENT (SUCTIONS) ×3 IMPLANT

## 2020-05-04 NOTE — Brief Op Note (Signed)
05/04/2020  2:14 PM  PATIENT:  Kristina Rowland  69 y.o. female  PRE-OPERATIVE DIAGNOSIS:  Spinal stenosis L3-4  POST-OPERATIVE DIAGNOSIS:  Spinal stenosis L3-4  PROCEDURE:  Procedure(s): Microlumbar decompression Lumbar three-four,  Revision Lumbar four-five (N/A)  SURGEON:  Surgeon(s) and Role:    Susa Day, MD - Primary  PHYSICIAN ASSISTANT:   ASSISTANTS: [place]   ANESTHESIA:   general  EBL:  100 mL   BLOOD ADMINISTERED:none  DRAINS: none   LOCAL MEDICATIONS USED:  MARCAINE     SPECIMEN:  No Specimen  DISPOSITION OF SPECIMEN:  N/A  COUNTS:  YES  TOURNIQUET:  * No tourniquets in log *  DICTATION: .Other Dictation: Dictation Number [id number]  PLAN OF CARE: Admit for overnight observation  PATIENT DISPOSITION:  PACU - hemodynamically stable.   Delay start of Pharmacological VTE agent (>24hrs) due to surgical blood loss or risk of bleeding: yes

## 2020-05-04 NOTE — Interval H&P Note (Signed)
History and Physical Interval Note:  05/04/2020 10:47 AM  Kristina Rowland  has presented today for surgery, with the diagnosis of Spinal stenosis L3-4.  The various methods of treatment have been discussed with the patient and family. After consideration of risks, benefits and other options for treatment, the patient has consented to  Procedure(s) with comments: Microlumbar decompression L3-4, possible L2-3, Revision L4-5 (N/A) - 2.5hrs as a surgical intervention.  The patient's history has been reviewed, patient examined, no change in status, stable for surgery.  I have reviewed the patient's chart and labs.  Questions were answered to the patient's satisfaction.     Johnn Hai

## 2020-05-04 NOTE — Anesthesia Procedure Notes (Signed)
Procedure Name: Intubation Date/Time: 05/04/2020 11:23 AM Performed by: Trinna Post., CRNA Pre-anesthesia Checklist: Patient identified, Emergency Drugs available, Suction available, Patient being monitored and Timeout performed Patient Re-evaluated:Patient Re-evaluated prior to induction Oxygen Delivery Method: Circle system utilized Preoxygenation: Pre-oxygenation with 100% oxygen Induction Type: IV induction Ventilation: Mask ventilation without difficulty Laryngoscope Size: Mac and 3 Grade View: Grade I Tube type: Oral Tube size: 7.0 mm Number of attempts: 1 Airway Equipment and Method: Stylet Placement Confirmation: ETT inserted through vocal cords under direct vision,  positive ETCO2 and breath sounds checked- equal and bilateral Secured at: 22 cm Tube secured with: Tape Dental Injury: Teeth and Oropharynx as per pre-operative assessment

## 2020-05-04 NOTE — Progress Notes (Signed)
Pt is s/p lumbar decompression. She does not have any drain in place. Will give vanc 1g IV x1 12hrs post pre-procedure dose.  Onnie Boer, PharmD, BCIDP, AAHIVP, CPP Infectious Disease Pharmacist 05/04/2020 4:13 PM

## 2020-05-04 NOTE — Interval H&P Note (Signed)
History and Physical Interval Note:  05/04/2020 10:46 AM  Kristina Rowland  has presented today for surgery, with the diagnosis of Spinal stenosis L3-4.  The various methods of treatment have been discussed with the patient and family. After consideration of risks, benefits and other options for treatment, the patient has consented to  Procedure(s) with comments: Microlumbar decompression L3-4, possible L2-3, Revision L4-5 (N/A) - 2.5hrs as a surgical intervention.  The patient's history has been reviewed, patient examined, no change in status, stable for surgery.  I have reviewed the patient's chart and labs.  Questions were answered to the patient's satisfaction.     Johnn Hai

## 2020-05-04 NOTE — Transfer of Care (Signed)
Immediate Anesthesia Transfer of Care Note  Patient: Kristina Rowland  Procedure(s) Performed: Microlumbar decompression Lumbar three-four,  Revision Lumbar four-five (N/A Spine Lumbar)  Patient Location: PACU  Anesthesia Type:General  Level of Consciousness: awake, alert  and oriented  Airway & Oxygen Therapy: Patient Spontanous Breathing and Patient connected to nasal cannula oxygen  Post-op Assessment: Report given to RN and Post -op Vital signs reviewed and stable  Post vital signs: Reviewed and stable  Last Vitals:  Vitals Value Taken Time  BP 112/98 05/04/20 1420  Temp 36.7 C 05/04/20 1420  Pulse 96 05/04/20 1422  Resp 25 05/04/20 1422  SpO2 97 % 05/04/20 1422  Vitals shown include unvalidated device data.  Last Pain:  Vitals:   05/04/20 0844  TempSrc: Oral  PainSc: 8          Complications: No complications documented.

## 2020-05-04 NOTE — Evaluation (Signed)
Occupational Therapy Evaluation Patient Details Name: Kristina Rowland MRN: [id number] DOB: 06-21-1950 Today's Date: 05/04/2020    History of Present Illness     Clinical Impression   PTA, pt was living with her husband and was independent. Currently, pt performing ADLs and functional mobility at Mod I level. Provided education and handout on back precautions, bed mobility, grooming, LB ADLs, toileting, and tub transfer with 3n1; pt demonstrated understanding. Answered all pt questions. Recommend dc home once medically stable per physician. All acute OT needs met and will sign off. Thank you.    Follow Up Recommendations  No OT follow up;Supervision/Assistance - 24 hour    Equipment Recommendations  3 in 1 bedside commode    Recommendations for Other Services       Precautions / Restrictions Precautions Precautions: Back Precaution Booklet Issued: Yes (comment) Precaution Comments: Provided education and handout on back precautions and comepnsatory techniques Required Braces or Orthoses: Other Brace Other Brace: No brace per MD order Restrictions Weight Bearing Restrictions: No      Mobility Bed Mobility Overal bed mobility: Modified Independent             General bed mobility comments: Education on log roll technique    Transfers Overall transfer level: Modified independent                    Balance Overall balance assessment: No apparent balance deficits (not formally assessed)                                         ADL either performed or assessed with clinical judgement   ADL Overall ADL's : Modified independent                                       General ADL Comments: Providing education on back precautions and compensatory techniques for ADLs to adhere to back precautions. Pt performing LB dressing, toileting, simulated tub transfer.     Vision         Perception     Praxis      Pertinent  Vitals/Pain Pain Assessment: Faces Faces Pain Scale: Hurts little more Pain Location: Sx site. stinging Pain Descriptors / Indicators: Spasm Pain Intervention(s): Monitored during session     Hand Dominance Right   Extremity/Trunk Assessment Upper Extremity Assessment Upper Extremity Assessment: Overall WFL for tasks assessed   Lower Extremity Assessment Lower Extremity Assessment: Defer to PT evaluation   Cervical / Trunk Assessment Cervical / Trunk Assessment: Other exceptions Cervical / Trunk Exceptions: s/p back sx   Communication Communication Communication: No difficulties   Cognition Arousal/Alertness: Awake/alert Behavior During Therapy: WFL for tasks assessed/performed Overall Cognitive Status: Within Functional Limits for tasks assessed                                     General Comments       Exercises     Shoulder Instructions      Home Living Family/patient expects to be discharged to:: Private residence Living Arrangements: Spouse/significant other Available Help at Discharge: Family;Available 24 hours/day Type of Home: House Home Access: Stairs to enter [employer] of Steps: 1 step no rail at front, 2 with no rail  at garage (pt somteimtes uses washing machine for railing)   Home Layout: Two level;Bed/bath upstairs;1/2 bath on main level Alternate Level Stairs-Number of Steps: flight Alternate Level Stairs-Rails: Left Bathroom Shower/Tub: Occupational psychologist: Standard Bathroom Accessibility: Yes   Home Equipment: Walker - 2 wheels          Prior Functioning/Environment Level of Independence: Independent                 OT Problem List: Decreased activity tolerance;Impaired balance (sitting and/or standing);Decreased knowledge of precautions;Decreased knowledge of use of DME or AE      OT Treatment/Interventions:      OT Goals(Current goals can be found in the care plan section) Acute Rehab OT  Goals Patient Stated Goal: "Walk without the vibating feeling" OT Goal Formulation: All assessment and education complete, DC therapy  OT Frequency:     Barriers to D/C:            Co-evaluation              AM-PAC OT "6 Clicks" Daily Activity     Outcome Measure Help from another person eating meals?: None Help from another person taking care of personal grooming?: None Help from another person toileting, which includes using toliet, bedpan, or urinal?: None Help from another person bathing (including washing, rinsing, drying)?: None Help from another person to put on and taking off regular upper body clothing?: None Help from another person to put on and taking off regular lower body clothing?: None 6 Click Score: 24   End of Session Nurse Communication: Mobility status  Activity Tolerance: Patient tolerated treatment well Patient left: in bed;with call bell/phone within reach  OT Visit Diagnosis: Unsteadiness on feet (R26.81);Other abnormalities of gait and mobility (R26.89);Muscle weakness (generalized) (M62.81)                Time: 4986-5168 OT Time Calculation (min): 21 min Charges:  OT General Charges $OT Visit: 1 Visit OT Evaluation $OT Eval Low Complexity: [place], OTR/L Acute Rehab Pager: [phone] Office: [place] 05/04/2020, 5:42 PM

## 2020-05-05 ENCOUNTER — Encounter (HOSPITAL_COMMUNITY): Payer: Self-pay | Admitting: Specialist

## 2020-05-05 DIAGNOSIS — M48061 Spinal stenosis, lumbar region without neurogenic claudication: Secondary | ICD-10-CM | POA: Diagnosis not present

## 2020-05-05 LAB — CBC
HCT: 39.5 % (ref 36.0–46.0)
Hemoglobin: 13.3 g/dL (ref 12.0–15.0)
MCH: 29.9 pg (ref 26.0–34.0)
MCHC: 33.7 g/dL (ref 30.0–36.0)
MCV: 88.8 fL (ref 80.0–100.0)
Platelets: 255 10*3/uL (ref 150–400)
RBC: 4.45 MIL/uL (ref 3.87–5.11)
RDW: 12.7 % (ref 11.5–15.5)
WBC: 10.6 10*3/uL — ABNORMAL HIGH (ref 4.0–10.5)
nRBC: 0 % (ref 0.0–0.2)

## 2020-05-05 LAB — BASIC METABOLIC PANEL
Anion gap: 9 (ref 5–15)
BUN: 11 mg/dL (ref 8–23)
CO2: 28 mmol/L (ref 22–32)
Calcium: 8.7 mg/dL — ABNORMAL LOW (ref 8.9–10.3)
Chloride: 102 mmol/L (ref 98–111)
Creatinine, Ser: 0.82 mg/dL (ref 0.44–1.00)
GFR, Estimated: >60 mL/min (ref >60)
Glucose, Bld: 132 mg/dL — ABNORMAL HIGH (ref 70–99)
Potassium: 3.7 mmol/L (ref 3.5–5.1)
Sodium: 139 mmol/L (ref 135–145)

## 2020-05-05 MED ORDER — DOCUSATE SODIUM 100 MG PO CAPS: 100.0000 mg | ORAL_CAPSULE | Freq: Two times a day (BID) | ORAL | 0 refills | Status: AC | PRN

## 2020-05-05 MED ORDER — OXYCODONE HCL 5 MG PO TABS
5.0000 mg | ORAL_TABLET | ORAL | 0 refills | Status: AC | PRN
Start: 2020-05-05 — End: ?

## 2020-05-05 MED ORDER — POLYETHYLENE GLYCOL 3350 17 G PO PACK: 17.0000 g | PACK | Freq: Every day | ORAL | 0 refills | Status: AC | PRN

## 2020-05-05 NOTE — Discharge Summary (Signed)
Physician Discharge Summary   Patient ID: Kristina Rowland MRN: [id number] DOB/AGE: November 09, 1950 69 y.o.  Admit date: 05/04/2020 Discharge date: 05/05/2020  Primary Diagnosis:   Spinal stenosis L3-4  Admission Diagnoses:  Past Medical History:  Diagnosis Date  . Anemia   . Anxiety   . Arthritis   . Cancer ([place])    leukemia  . 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 Depression   . Headache   . Hypertension    Discharge Diagnoses:   Active Problems:   Spinal stenosis, lumbar  Procedure:  Procedure(s) (LRB): Microlumbar decompression Lumbar three-four,  Revision Lumbar four-five (N/A)   Consults: None  HPI:  see H&P    Laboratory Data: Hospital Outpatient Visit on 05/02/2020  Component Date Value Ref Range Status  . SARS Coronavirus 2 05/02/2020 NEGATIVE  NEGATIVE Final   Comment: (NOTE) SARS-CoV-2 target nucleic acids are NOT DETECTED.  The SARS-CoV-2 RNA is generally detectable in upper and lower respiratory specimens during the acute phase of infection. Negative results do not preclude SARS-CoV-2 infection, do not rule out co-infections with other pathogens, and should not be used as the sole basis for treatment or other patient management decisions. Negative results must be combined with clinical observations, patient history, and epidemiological information. The expected result is Negative.  Fact Sheet for Patients: SugarRoll.be  Fact Sheet for Healthcare Providers: https://www.woods-mathews.com/  This test is not yet approved or cleared by the [place] FDA and  has been authorized for detection and/or diagnosis of SARS-CoV-2 by FDA under an Emergency Use Authorization (EUA). This EUA will remain  in effect (meaning this test can be used) for the duration of the COVID-19 declaration under Se                          ction 564(b)(1) of the Act, 21 U.S.C. section 360bbb-3(b)(1),  unless the authorization is terminated or revoked sooner.  Performed at [facility], [place] [address], [place], [place] [address]    Recent Labs    05/02/20 1414 05/05/20 0217  HGB 15.7* 13.3   Recent Labs    05/02/20 1414 05/05/20 0217  WBC 5.9 10.6*  RBC 5.19* 4.45  HCT 47.1* 39.5  PLT 285 255   Recent Labs    05/02/20 1414 05/05/20 0217  NA 141 139  K 3.4* 3.7  CL 105 102  CO2 24 28  BUN 11 11  CREATININE 0.76 0.82  GLUCOSE 115* 132*  CALCIUM 8.4* 8.7*   No results for input(s): LABPT, INR in the last 72 hours.  X-Rays:DG Lumbar Spine 2-3 Views  Result Date: 05/04/2020 CLINICAL DATA:  Localization images for lumbar decompression EXAM: LUMBAR SPINE - 2-3 VIEW COMPARISON:  May 02, 2020 FINDINGS: Initial cross-table lateral lumbar image time stamped 11:35: 53 submitted. Metallic probe tips are posterior to the superior aspect of the L3 vertebral body in the mid portion of the L4 vertebral body. There is stable 4 mm of anterolisthesis of L4 on L5. No fracture. No new spondylolisthesis. Second cross-table lateral lumbar image time stamped 12:10: 21 p.m. submitted. Metallic probe tip is posterior to the mid L3 vertebral body with cutting tool overlying the L3 spinous process. The mild spondylolisthesis of L4 on L5 remains. No fracture. Third submitted cross-table lateral lumbar image time stamped 1:91:47 p.m. metallic probe tips are posterior to the mid L3 and mid L4 vertebral bodies. Mild spondylolisthesis at  L4-5 remains. Mild disc space narrowing at L4-5 present. No fracture. IMPRESSION: On final submitted cross-table lateral image, metallic probe tips are posterior to the mid L3 and mid L4 vertebral bodies. Stable 4 mm of anterolisthesis of L4 on L5. No fracture. No new spondylolisthesis. There is disc space narrowing at L4-5. Electronically Signed   By: Lowella Grip III M.D.   On: 05/04/2020 14:39   DG Lumbar Spine 2-3 Views  Result Date:  05/02/2020 CLINICAL DATA:  Preop for lumbar surgery. EXAM: LUMBAR SPINE - 2-3 VIEW COMPARISON:  Radiographs 10/03/2017 FINDINGS: There are 5 non rib-bearing lumbar vertebral bodies. The last full intervertebral disc space is labeled L5-S1. Moderate degenerative lumbar spondylosis with multilevel disc disease and facet disease. Degenerative anterolisthesis of L3 and L4 is noted. Moderate degenerative changes also noted in the lower thoracic spine. IMPRESSION: 1. Multilevel degenerative lumbar spondylosis with multilevel disc disease and facet disease. 2. Degenerative anterolisthesis of L3 and L4. Electronically Signed   By: Marijo Sanes M.D.   On: 05/02/2020 16:48    EKG: Orders placed or performed during the hospital encounter of 05/02/20  . EKG 12 lead per protocol  . EKG 12 lead per protocol     Hospital Course: Patient was admitted to [facility] and taken to the OR and underwent the above state procedure without complications.  Patient tolerated the procedure well and was later transferred to the recovery room and then to the orthopaedic floor for postoperative care.  They were given PO and IV analgesics for pain control following their surgery.  They were given 24 hours of postoperative antibiotics.   PT was consulted postop to assist with mobility and transfers.  The patient was allowed to be WBAT with therapy and was taught back precautions. Discharge planning was consulted to help with postop disposition and equipment needs.  Patient had a fair night on the evening of surgery and started to get up OOB with therapy on day one. Patient was seen in rounds and was ready to go home on day one.  They were given discharge instructions and dressing directions.  They were instructed on when to follow up in the office with Dr. Tonita Cong.   Diet: Regular diet Activity:WBAT, Lspine precautions Follow-up:in 10-14 days Disposition - Home Discharged Condition: good   Discharge Instructions     Call MD / Call 911   Complete by: As directed    If you experience chest pain or shortness of breath, CALL 911 and be transported to the hospital emergency room.  If you develope a fever above 101 F, pus (white drainage) or increased drainage or redness at the wound, or calf pain, call your surgeon's office.   Constipation Prevention   Complete by: As directed    Drink plenty of fluids.  Prune juice may be helpful.  You may use a stool softener, such as Colace (over the counter) 100 mg twice a day.  Use MiraLax (over the counter) for constipation as needed.   Diet - low sodium heart healthy   Complete by: As directed    Increase activity slowly as tolerated   Complete by: As directed      Allergies as of 05/05/2020      Reactions   Cephalosporins Anaphylaxis, Swelling, Rash   Has patient had a PCN reaction causing immediate rash, facial/tongue/throat swelling, SOB or lightheadedness with hypotension: Yes Has patient had a PCN reaction causing severe rash involving mucus membranes or skin necrosis: Yes Has patient had  a PCN reaction that required hospitalization:WAS [place] Has patient had a PCN reaction occurring within the last 10 years: No If all of the above answers are "NO", then may proceed with Cephalosporin use.   Penicillins Anaphylaxis, Swelling, Rash   Has patient had a PCN reaction causing immediate rash, facial/tongue/throat swelling, SOB or lightheadedness with hypotension: Yes Has patient had a PCN reaction causing severe rash involving mucus membranes or skin necrosis: Yes Has patient had a PCN reaction that required hospitalization:No Has patient had a PCN reaction occurring within the last 10 years: No If all of the above answers are "NO", then may proceed with Cephalosporin use.   Gabapentin    Cloudy headed       Medication List    STOP taking these medications   ibuprofen 200 MG tablet Commonly known as: ADVIL     TAKE these medications    acyclovir 800 MG tablet Commonly known as: ZOVIRAX Take 800 mg by mouth 3 (three) times daily as needed (fever blisters.).   atomoxetine 80 MG capsule Commonly known as: STRATTERA Take 80 mg by mouth daily.   Biotin 10 MG Caps Take 10 mg by mouth daily.   Buprenorphine HCl-Naloxone HCl 2-0.5 MG Film Place 0.125 strips under the tongue at bedtime. Take 1/8 of a strip   diphenhydrAMINE 25 MG tablet Commonly known as: BENADRYL Take 25 mg by mouth daily as needed for allergies.   docusate sodium 100 MG capsule Commonly known as: COLACE Take 1 capsule (100 mg total) by mouth 2 (two) times daily as needed for mild constipation.   DULoxetine 60 MG capsule Commonly known as: CYMBALTA Take 60 mg by mouth daily.   estradiol 1 MG tablet Commonly known as: ESTRACE Take 0.3333 mg by mouth at bedtime.   lisinopril 20 MG tablet Commonly known as: ZESTRIL Take 20 mg by mouth at bedtime.   methocarbamol 500 MG tablet Commonly known as: ROBAXIN Take 1 tablet (500 mg total) by mouth every 6 (six) hours as needed for muscle spasms.   oxyCODONE 5 MG immediate release tablet Commonly known as: Oxy IR/ROXICODONE Take 1-2 tablets (5-10 mg total) by mouth every 3 (three) hours as needed for moderate pain or severe pain ((score 4 to 6)).   polyethylene glycol 17 g packet Commonly known as: MIRALAX / GLYCOLAX Take 17 g by mouth daily as needed for mild constipation.       Follow-up Information    Susa Day, MD Follow up in 2 week(s).   Specialty: Orthopedic Surgery Contact information: [address] [place] [address] [phone]               Signed: Lacie Draft, PA-C Orthopaedic Surgery 05/05/2020, 7:32 AM

## 2020-05-05 NOTE — Progress Notes (Signed)
Subjective: 1 Day Post-Op Procedure(s) (LRB): Microlumbar decompression Lumbar three-four,  Revision Lumbar four-five (N/A) Patient reports pain as mild.    Objective: Vital signs in last 24 hours: Temp:  [97.9 F (36.6 C)-98.5 F (36.9 C)] 98.2 F (36.8 C) (12/03 0403) Pulse Rate:  [78-107] 78 (12/03 0403) Resp:  [9-27] 18 (12/03 0403) BP: (102-172)/(51-98) 128/68 (12/03 0403) SpO2:  [90 %-99 %] 97 % (12/03 0403) Weight:  [95.6 kg] 95.6 kg (12/02 0844)  Intake/Output from previous day: 12/02 0701 - 12/03 0700 In: 1000 [I.V.:1000] Out: 100 [Blood:100] Intake/Output this shift: No intake/output data recorded.  Recent Labs    05/02/20 1414 05/05/20 0217  HGB 15.7* 13.3   Recent Labs    05/02/20 1414 05/05/20 0217  WBC 5.9 10.6*  RBC 5.19* 4.45  HCT 47.1* 39.5  PLT 285 255   Recent Labs    05/02/20 1414 05/05/20 0217  NA 141 139  K 3.4* 3.7  CL 105 102  CO2 24 28  BUN 11 11  CREATININE 0.76 0.82  GLUCOSE 115* 132*  CALCIUM 8.4* 8.7*   No results for input(s): LABPT, INR in the last 72 hours.  Neurologically intact ABD soft Neurovascular intact Sensation intact distally Intact pulses distally Dorsiflexion/Plantar flexion intact Incision: dressing C/D/I No cellulitis present Compartment soft No calf pain or sign of DVT  Assessment/Plan: 1 Day Post-Op Procedure(s) (LRB): Microlumbar decompression Lumbar three-four,  Revision Lumbar four-five (N/A) Advance diet Up with therapy D/C IV fluids D/C home today Discussed D/C instructions, dressing instructions, Lspine precautions  Cecilie Kicks 05/05/2020, 7:28 AM

## 2020-05-05 NOTE — Discharge Instructions (Signed)

## 2020-05-05 NOTE — Progress Notes (Signed)
Patient is discharged from room 3C04 at this time. Alert and in stable condition. IV site d/c'd and instructions read to patient with understanding verbalized and all questions answered. Left unit via wheelchair with all belongings at side. 

## 2020-05-05 NOTE — Anesthesia Postprocedure Evaluation (Signed)
Anesthesia Post Note  Patient: Kristina Rowland  Procedure(s) Performed: Microlumbar decompression Lumbar three-four,  Revision Lumbar four-five (N/A Spine Lumbar)     Patient location during evaluation: PACU Anesthesia Type: General Level of consciousness: awake and alert Pain management: pain level controlled Vital Signs Assessment: post-procedure vital signs reviewed and stable Respiratory status: spontaneous breathing, nonlabored ventilation, respiratory function stable and patient connected to nasal cannula oxygen Cardiovascular status: blood pressure returned to baseline and stable Postop Assessment: no apparent nausea or vomiting Anesthetic complications: no   No complications documented.  Last Vitals:  Vitals:   05/05/20 0403 05/05/20 0736  BP: 128/68 (!) 102/51  Pulse: 78 (!) 105  Resp: 18 18  Temp: 36.8 C 36.9 C  SpO2: 97% 96%    Last Pain:  Vitals:   05/05/20 0751  TempSrc:   PainSc: 4                  Mikayela Deats P Shantale Holtmeyer

## 2020-05-05 NOTE — Evaluation (Signed)
Physical Therapy Evaluation Patient Details Name: Kristina Rowland MRN: [id number] DOB: 03-12-1951 Today's Date: 05/05/2020   History of Present Illness  PAtient is a 69 y/o female who presents s/p L3-4, 4-5 microlumbar decompression and L4-5 revision 12/2. PMH includes left TKA, depression, leukemia, OA, previous back surgery.  Clinical Impression  Patient presents with pain and post surgical deficits s/p above surgery. Pt was independent with ADLs/IADLs PTA and lives with spouse and a new puppy. Today, pt tolerated transfers, gait and stair training with Mod I for safety. Some mild drifting noted initially. Education re: back precautions, stair training, walking program, positioning, log roll technique, safety with puppy etc. Pt does not require skilled therapy services. All education completed. Discharge from therapy.    Follow Up Recommendations No PT follow up;Supervision - Intermittent    Equipment Recommendations  None recommended by PT    Recommendations for Other Services       Precautions / Restrictions Precautions Precautions: Back Precaution Booklet Issued: Yes (comment) Precaution Comments: Reviewed precautions Other Brace: No brace per MD order Restrictions Weight Bearing Restrictions: No      Mobility  Bed Mobility               General bed mobility comments: Reviewed log roll technique.    Transfers Overall transfer level: Modified independent               General transfer comment: Stood from chair x2. No difficulties.  Ambulation/Gait Ambulation/Gait assistance: Modified independent (Device/Increase time) Gait Distance (Feet): 400 Feet Assistive device: None Gait Pattern/deviations: Step-through pattern;Decreased stride length;Drifts right/left   Gait velocity interpretation: 1.31 - 2.62 ft/sec, indicative of limited community ambulator General Gait Details: Steady gait with mild drifting noted initially. 2/4 DOE.  Stairs Stairs:  Yes Stairs assistance: Modified independent (Device/Increase time) Stair Management: One rail Left Number of Stairs: 13 General stair comments: Cues for technique/safety.  Wheelchair Mobility    Modified Rankin (Stroke Patients Only)       Balance Overall balance assessment: Needs assistance Sitting-balance support: Feet supported;No upper extremity supported Sitting balance-Leahy Scale: Good     Standing balance support: During functional activity Standing balance-Leahy Scale: Good                               Pertinent Vitals/Pain Pain Assessment: 0-10 Pain Score: 4  Pain Location: back Pain Descriptors / Indicators: Operative site guarding;Sore Pain Intervention(s): Monitored during session;Repositioned    Home Living Family/patient expects to be discharged to:: Private residence Living Arrangements: Spouse/significant other Available Help at Discharge: Family;Available 24 hours/day Type of Home: House Home Access: Stairs to enter   [employer] of Steps: 1 step no rail at front, 2 with no rail at garage (pt somteimtes uses washing machine for railing) Home Layout: Two level;Bed/bath upstairs;1/2 bath on main level Home Equipment: Walker - 2 wheels      Prior Function Level of Independence: Independent         Comments: uses a walking stick outside     Hand Dominance   Dominant Hand: Right    Extremity/Trunk Assessment   Upper Extremity Assessment Upper Extremity Assessment: Defer to OT evaluation    Lower Extremity Assessment Lower Extremity Assessment: Overall WFL for tasks assessed    Cervical / Trunk Assessment Cervical / Trunk Assessment: Other exceptions Cervical / Trunk Exceptions: s/p back sx  Communication   Communication: No difficulties  Cognition Arousal/Alertness: Awake/alert Behavior  During Therapy: WFL for tasks assessed/performed Overall Cognitive Status: Within Functional Limits for tasks assessed                                         General Comments      Exercises     Assessment/Plan    PT Assessment Patent does not need any further PT services  PT Problem List         PT Treatment Interventions      PT Goals (Current goals can be found in the Care Plan section)  Acute Rehab PT Goals Patient Stated Goal: to go home PT Goal Formulation: All assessment and education complete, DC therapy    Frequency     Barriers to discharge        Co-evaluation               AM-PAC PT "6 Clicks" Mobility  Outcome Measure Help needed turning from your back to your side while in a flat bed without using bedrails?: None Help needed moving from lying on your back to sitting on the side of a flat bed without using bedrails?: None Help needed moving to and from a bed to a chair (including a wheelchair)?: None Help needed standing up from a chair using your arms (e.g., wheelchair or bedside chair)?: None Help needed to walk in hospital room?: None Help needed climbing 3-5 steps with a railing? : None 6 Click Score: 24    End of Session   Activity Tolerance: Patient tolerated treatment well Patient left: in chair;with call bell/phone within reach Nurse Communication: Mobility status PT Visit Diagnosis: Pain Pain - part of body:  (back)    Time: 8184-0375 PT Time Calculation (min) (ACUTE ONLY): 12 min   Charges:   PT Evaluation $PT Eval Moderate Complexity: 1 Mod          Marisa Severin, PT, DPT Acute Rehabilitation Services Pager [phone] Office [place] 05/05/2020, 7:34 AM

## 2020-05-05 NOTE — Op Note (Signed)
NAME: Kristina Rowland, Kristina Rowland PZ:[id number] ACCOUNT [id number] DATE OF BIRTH:Oct 06, 1950 FACILITY: [facility] LOCATION: [facility] PHYSICIAN:Ronell Duffus Windy Kalata, MD  OPERATIVE REPORT  DATE OF PROCEDURE:  05/04/2020  PREOPERATIVE DIAGNOSIS:  Spinal stenosis, synovial cyst L3-L4.  POSTOPERATIVE DIAGNOSIS:  Spinal stenosis, synovial cyst L3-L4, spinal stenosis, L2-L3.  PROCEDURE PERFORMED: 1.  Microlumbar decompression at L3-L4 with bilateral hemilaminotomies and foraminotomies of L4. 2.  Gill laminectomy of L3.   3.  Decompression L2-L3. 4.  Excision of synovial cyst.  ASSISTANT:  Lacie Draft, PA.    HISTORY:  This is a 69 year old female with bilateral lower extremity radicular pain secondary to multifactorial severe spinal stenosis at L3-L4 due to facet hypertrophy, ligamentum flavum hypertrophy and a large synovial cyst emanating from the facet  joint at L4-L5, L3-L4 on the left.  Had a small one on L3-L4 and the right.  We discussed microlumbar decompression at L3-L4, possible L2-L3 and excision of synovial cyst.  Risks and benefits discussed including bleeding, infection, damage to  neurovascular structures, no change in symptoms, worsening symptoms, DVT, PE, anesthetic complications, etc.  DESCRIPTION OF PROCEDURE:  With the patient in supine position after induction of adequate general anesthesia, a gram of vancomycin and gentamicin.   The patient was placed prone on the Wilson frame.  Foley to gravity.  All bony prominences well padded.   Lumbar region was prepped and draped in the usual sterile fashion.  Two 18-gauge spinal needles utilized to localize 3-4 interspace, confirmed with x-ray.  Incision was made from the spinous process of 2-4.  Subcutaneous tissue was dissected.   Electrocautery was utilized to achieve hemostasis.  Dorsal lumbar fascia divided in line with the skin incision.  Paraspinous muscle elevated from lamina of 3-4.  McCullough retractor was placed.  Operating  microscope was draped and brought in the  surgical field.  Confirmatory radiograph obtained.  This confirmatory for space.  A Leksell rongeur was utilized to remove the spinous process of 3 and a partial of 4.  Following this, hemilaminotomy was required at edge of 3 performed with Leksell  rongeur.  I then extended a central laminectomy with a 2 mm Kerrison extending cephalad.  As I extended cephalad we also performed hemilaminotomies of the caudad edge of 3.  This was a preoperative plan to be above the level of the synovial cyst, which  required excision of the lamina of 3.  Continued cephalad until detaching the ligamentum flavum.  Following this, I continued cephalad and removed the entire neural arch of 3.  I then utilized a micro curette to detach ligamentum flavum from the cephalad  edge of 4.  I then performed partial medial facetectomy of the inferior articulating process of 3 preserving the pars.  I then began removal of the ligamentum flavum from the interspace on the right.  Removing ligamentum flavum utilizing a Woodson  retractor to protect the thecal sac.  I then performed a foraminotomy of L4 on the right.  Then, with a straight curette skeletonized the superior articulating process of 4.  I then used a Penfield 4 to protect the neural elements.  I decompressed the  lateral recess to the medial border of the pedicle.  A small synovial cyst removed from the facet on the right.  Protecting the neural elements at all time bone wax was placed on the cancellous surfaces.  Bipolar cautery was utilized to achieve  hemostasis as was thrombin-soaked Gelfoam.  Attention was turned back towards the left side.  Performed a  hemilaminotomy of the cephalad edge of 4, performed a foraminotomy of L4.  We skeletonized the superior articulating process.  Woodson retractor was  utilized to identify the cephalad edge of the synovial cyst.  I removed ligamentum flavum from the interspace on the left.  This  exposed the large synovial cyst compressing the thecal sac extending into the 3 foramen and 4 foramen.  I meticulously  developed a plane between the thecal sac and the synovial cyst.  Then cephalad and caudad in the middle portion, there was a portion of the synovial cyst adhesed to the thecal sac.  I then decompressed the synovial cyst, removing its gelatinous content  and removed the cyst that extended into the facet at L3-L4 on the left.  I performed a foraminotomy of L3.  Bipolar cautery was utilized to achieve hemostasis.  Decompressed the lateral recess to the medial border of the pedicle.  We sent a synovial cyst  and its contents for specimen.  It appeared to be consistent with a normal synovial cyst tissue.  After developing a plane between the thecal sac and the synovial cyst, there was good restoration of the thecal sac.  Good mobility.  A Woodson retractor  freely passed in the foramen of 4 and 3 and the lateral recess at 3-4.  At the removal of the neural arch of 3 and removed ligamentum flavum 2-3, this helped gain access to thecal sac cephalad of the synovial cyst, adhesions.  Following this, again we  placed bone wax on the cancellous surfaces.  A confirmatory radiograph obtained with retractors in the foramen with 3 and 4.  There was no disk herniation noted.  Meticulously copious irrigation with antibiotic irrigation.  Inspection revealed no  evidence of CSF leakage or active bleeding.  Good restoration of the thecal sac and decompression of the nerve roots.  We then used thrombin-soaked Gelfoam, which was removed prior to closure except for a very small 2 mm x 2 mm piece near the foramen on  the left at L4.  After confirmatory radiographs and copious irrigation I removed the McCullough retractor.  Paraspinous muscles inspected.  Active bleeding was cauterized.  Following this, an inspection revealed no evidence of CSF leakage or active  bleeding.  I closed the dorsal lumbar fascia with #1  Vicryl interrupted figure-of-eight suture, small aperture at the inferior aspect of the incision, subcutaneous with 2-0 and skin with staples.  The wound was dressed sterilely, placed supine on the  hospital bed, extubated without difficulty and transported to the recovery room in satisfactory condition.  The patient tolerated the procedure well.  No complications.    Blood loss 100 mL.  HN/NUANCE  D:05/04/2020 T:05/05/2020 JOB:[id number]

## 2021-09-01 IMAGING — CR DG LUMBAR SPINE 2-3V
3 series · 3 of 3 positions shown · non-contrast
Comparison: May 02, 2020

CLINICAL DATA: Localization images for lumbar decompression

EXAM:
LUMBAR SPINE - 2-3 VIEW

[lateral (1 of 3)]
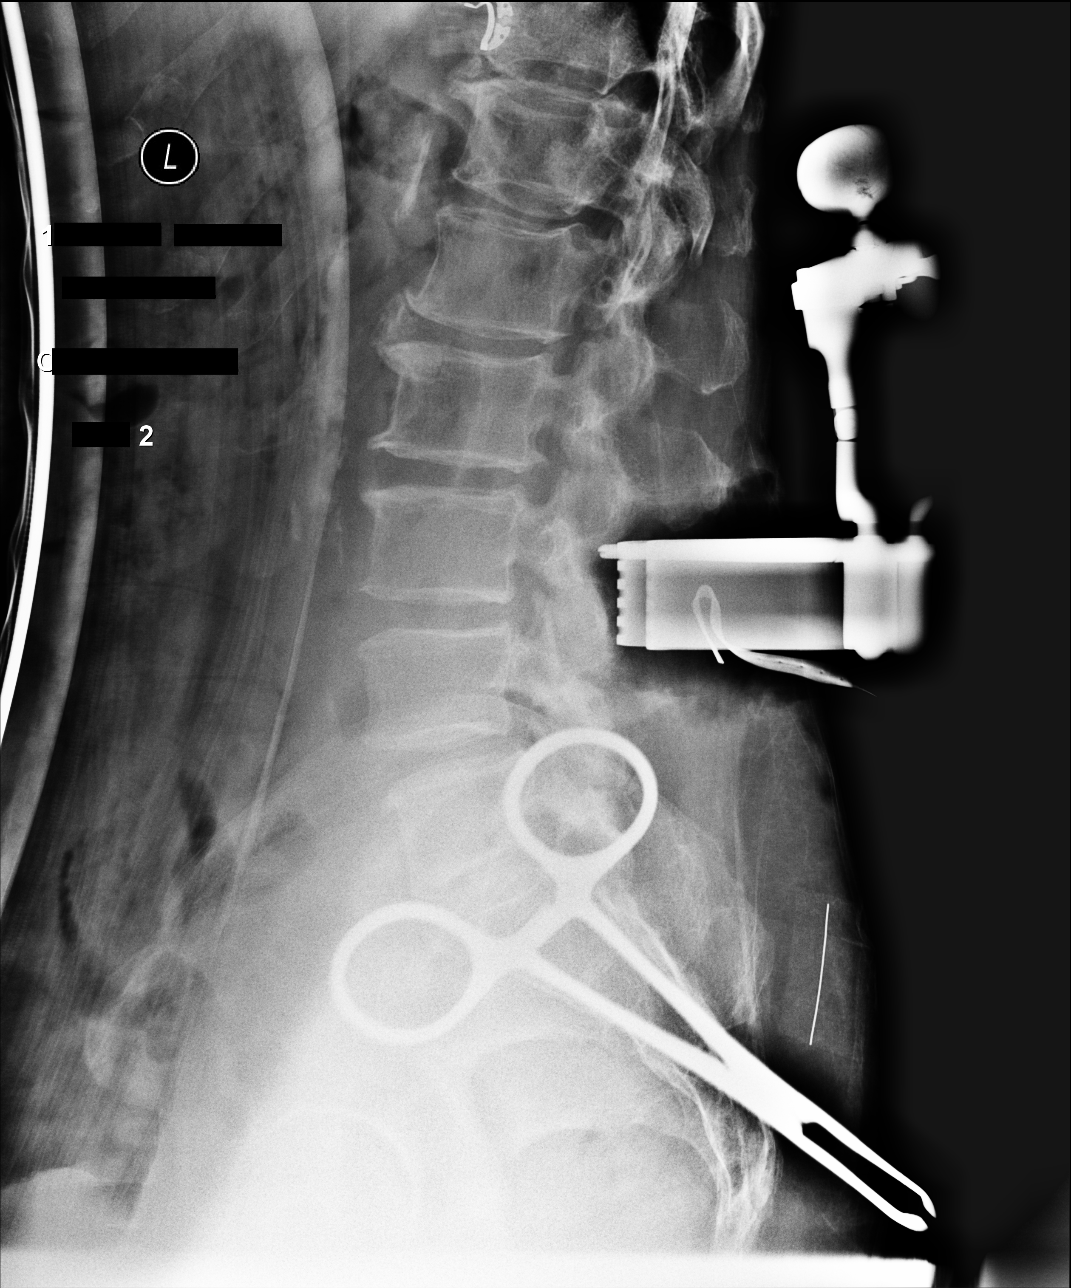

[lateral (2 of 3)]
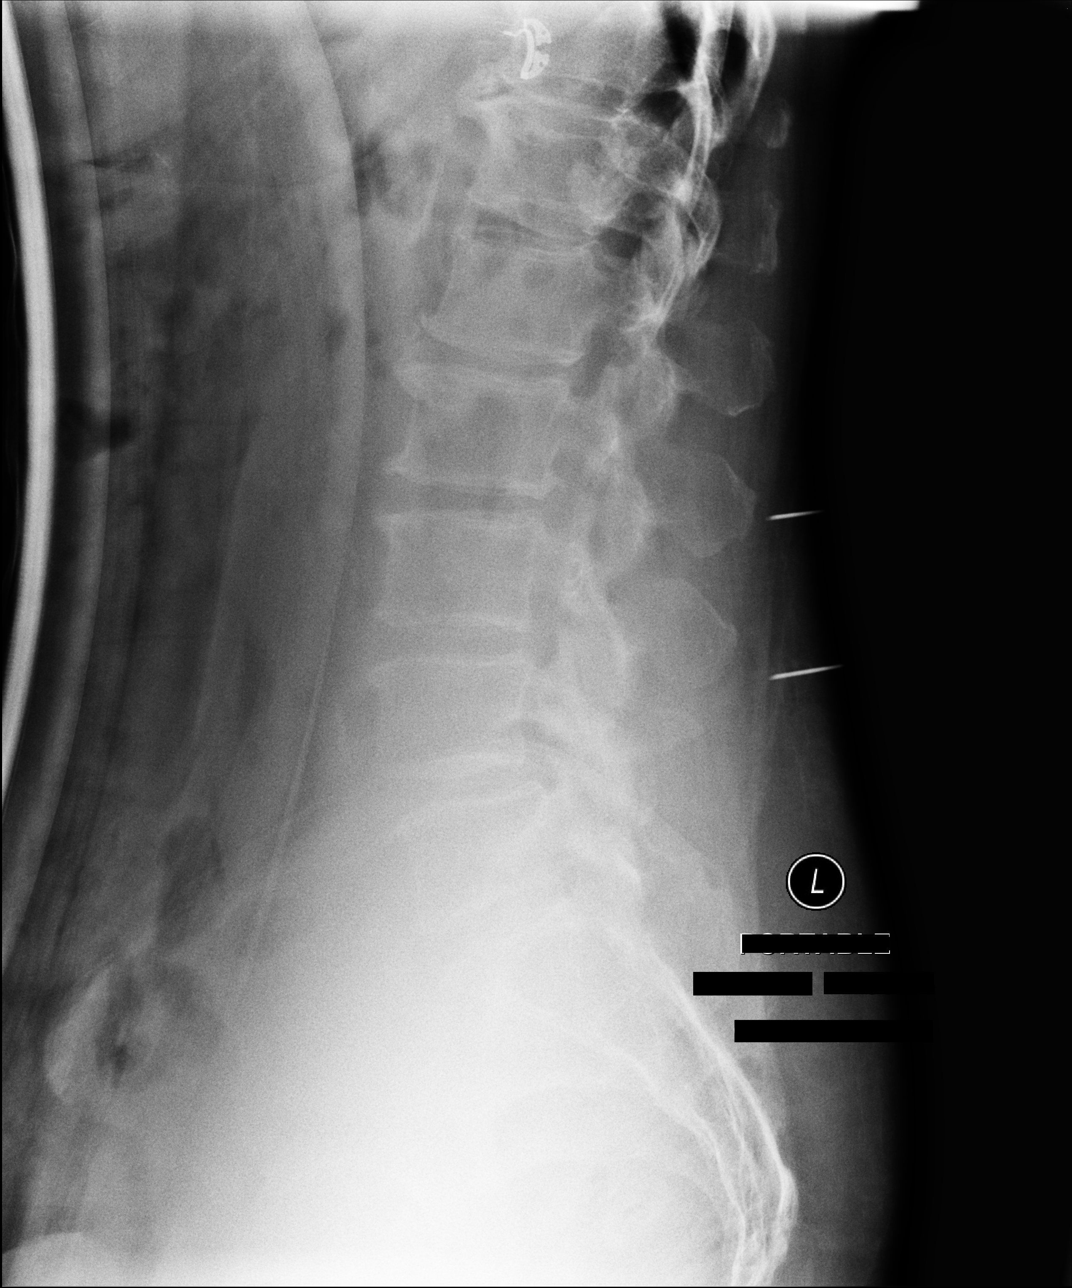

[lateral (3 of 3)]
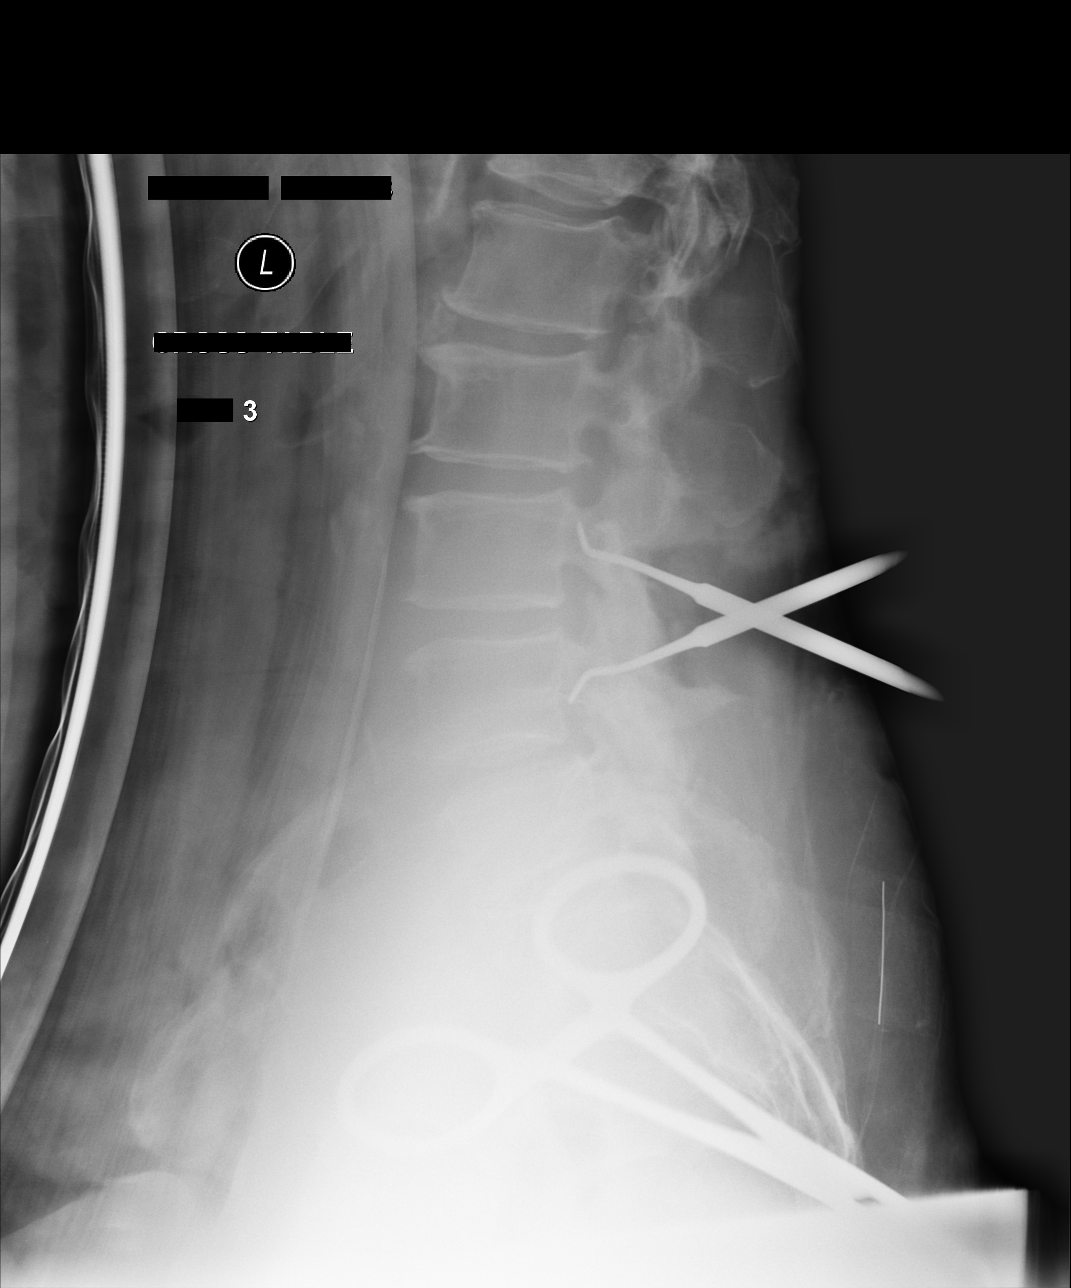

[3 of 3 positions shown; findings below may reference images not displayed]

FINDINGS: Initial cross-table lateral lumbar image time stamped [DATE]: 53
submitted. Metallic probe tips are posterior to the superior aspect
of the L3 vertebral body in the mid portion of the L4 vertebral
body. There is stable 4 mm of anterolisthesis of L4 on L5. No
fracture. No new spondylolisthesis.

Second cross-table lateral lumbar image time stamped [DATE]: 21 p.m.
submitted. Metallic probe tip is posterior to the mid L3 vertebral
body with cutting tool overlying the L3 spinous process. The mild
spondylolisthesis of L4 on L5 remains. No fracture.

Third submitted cross-table lateral lumbar image time stamped
[DATE] p.m. metallic probe tips are posterior to the mid L3 and mid
L4 vertebral bodies. Mild spondylolisthesis at L4-5 remains. Mild
disc space narrowing at L4-5 present. No fracture.
IMPRESSION: On final submitted cross-table lateral image, metallic probe tips
are posterior to the mid L3 and mid L4 vertebral bodies. Stable 4 mm
of anterolisthesis of L4 on L5. No fracture. No new
spondylolisthesis. There is disc space narrowing at L4-5.

## 2024-07-02 ENCOUNTER — Ambulatory Visit: Payer: Self-pay | Admitting: Physician Assistant

## 2024-07-02 ENCOUNTER — Encounter: Payer: Self-pay | Admitting: Physician Assistant

## 2024-07-02 ENCOUNTER — Ambulatory Visit: Payer: Self-pay

## 2024-07-02 VITALS — BP 125/71 | HR 82 | Resp 20 | Ht 65.0 in

## 2024-07-02 DIAGNOSIS — F0390 Unspecified dementia without behavioral disturbance: Secondary | ICD-10-CM | POA: Diagnosis not present

## 2024-07-02 DIAGNOSIS — R413 Other amnesia: Secondary | ICD-10-CM | POA: Diagnosis not present

## 2024-07-02 NOTE — Patient Instructions (Addendum)
 CSF for LP at [facility] [phone] MRI of the brain  at [facility] [phone]

## 2024-07-03 DIAGNOSIS — R413 Other amnesia: Secondary | ICD-10-CM | POA: Insufficient documentation

## 2024-08-11 ENCOUNTER — Other Ambulatory Visit

## 2024-08-16 ENCOUNTER — Other Ambulatory Visit
